# Patient Record
Sex: Female | Born: 1960 | Race: White | Hispanic: No | Marital: Married | Smoking: Never smoker
Health system: Southern US, Community
[De-identification: ages and names within clinical notes are randomized; demographics above are authoritative.]

## PROBLEM LIST (undated history)

## (undated) DIAGNOSIS — G43109 Migraine with aura, not intractable, without status migrainosus: Principal | ICD-10-CM

## (undated) DIAGNOSIS — G43909 Migraine, unspecified, not intractable, without status migrainosus: Secondary | ICD-10-CM

## (undated) DIAGNOSIS — E669 Obesity, unspecified: Secondary | ICD-10-CM

## (undated) DIAGNOSIS — C801 Malignant (primary) neoplasm, unspecified: Secondary | ICD-10-CM

## (undated) DIAGNOSIS — R42 Dizziness and giddiness: Secondary | ICD-10-CM

## (undated) DIAGNOSIS — E538 Deficiency of other specified B group vitamins: Secondary | ICD-10-CM

## (undated) DIAGNOSIS — M1990 Unspecified osteoarthritis, unspecified site: Secondary | ICD-10-CM

## (undated) DIAGNOSIS — Z862 Personal history of diseases of the blood and blood-forming organs and certain disorders involving the immune mechanism: Secondary | ICD-10-CM

## (undated) HISTORY — DX: Personal history of diseases of the blood and blood-forming organs and certain disorders involving the immune mechanism: Z86.2

## (undated) HISTORY — DX: Migraine, unspecified, not intractable, without status migrainosus: G43.909

## (undated) HISTORY — DX: Migraine with aura, not intractable, without status migrainosus: G43.109

## (undated) HISTORY — DX: Dizziness and giddiness: R42

## (undated) HISTORY — DX: Deficiency of other specified B group vitamins: E53.8

## (undated) HISTORY — PX: TOE SURGERY: SHX1073

## (undated) HISTORY — DX: Obesity, unspecified: E66.9

---

## 2003-03-17 ENCOUNTER — Other Ambulatory Visit: Admission: RE | Admit: 2003-03-17 | Discharge: 2003-03-17 | Payer: Self-pay | Admitting: Family Medicine

## 2004-05-19 ENCOUNTER — Ambulatory Visit: Payer: Self-pay | Admitting: Family Medicine

## 2005-06-05 ENCOUNTER — Ambulatory Visit: Payer: Self-pay | Admitting: Family Medicine

## 2005-06-19 ENCOUNTER — Encounter: Admission: RE | Admit: 2005-06-19 | Discharge: 2005-06-19 | Payer: Self-pay | Admitting: Family Medicine

## 2006-07-19 ENCOUNTER — Encounter: Admission: RE | Admit: 2006-07-19 | Discharge: 2006-07-19 | Payer: Self-pay | Admitting: Obstetrics & Gynecology

## 2008-12-22 ENCOUNTER — Ambulatory Visit: Payer: Self-pay | Admitting: Internal Medicine

## 2008-12-22 DIAGNOSIS — E785 Hyperlipidemia, unspecified: Secondary | ICD-10-CM

## 2008-12-22 DIAGNOSIS — R5383 Other fatigue: Secondary | ICD-10-CM

## 2008-12-22 DIAGNOSIS — R5381 Other malaise: Secondary | ICD-10-CM

## 2008-12-22 DIAGNOSIS — R42 Dizziness and giddiness: Secondary | ICD-10-CM

## 2008-12-29 ENCOUNTER — Encounter (INDEPENDENT_AMBULATORY_CARE_PROVIDER_SITE_OTHER): Payer: Self-pay | Admitting: *Deleted

## 2008-12-29 LAB — CONVERTED CEMR LAB
ALT: 14 units/L (ref 0–35)
AST: 20 units/L (ref 0–37)
Albumin: 3.6 g/dL (ref 3.5–5.2)
Alkaline Phosphatase: 77 units/L (ref 39–117)
BUN: 11 mg/dL (ref 6–23)
Basophils Absolute: 0 10*3/uL (ref 0.0–0.1)
Basophils Relative: 0 % (ref 0.0–3.0)
Bilirubin, Direct: 0.1 mg/dL (ref 0.0–0.3)
CO2: 26 meq/L (ref 19–32)
Calcium: 8.9 mg/dL (ref 8.4–10.5)
Chloride: 106 meq/L (ref 96–112)
Creatinine, Ser: 0.6 mg/dL (ref 0.4–1.2)
Eosinophils Absolute: 0.1 10*3/uL (ref 0.0–0.7)
Eosinophils Relative: 1.1 % (ref 0.0–5.0)
Free T4: 0.9 ng/dL (ref 0.6–1.6)
GFR calc non Af Amer: 113.74 mL/min (ref >60)
Glucose, Bld: 75 mg/dL (ref 70–99)
HCT: 36.9 % (ref 36.0–46.0)
Hemoglobin: 12.3 g/dL (ref 12.0–15.0)
Lymphocytes Relative: 30 % (ref 12.0–46.0)
Lymphs Abs: 1.9 10*3/uL (ref 0.7–4.0)
MCHC: 33.5 g/dL (ref 30.0–36.0)
MCV: 83.4 fL (ref 78.0–100.0)
Monocytes Absolute: 0.3 10*3/uL (ref 0.1–1.0)
Monocytes Relative: 4.1 % (ref 3.0–12.0)
Neutro Abs: 3.9 10*3/uL (ref 1.4–7.7)
Neutrophils Relative %: 64.8 % (ref 43.0–77.0)
Platelets: 337 10*3/uL (ref 150.0–400.0)
Potassium: 3.8 meq/L (ref 3.5–5.1)
RBC: 4.42 M/uL (ref 3.87–5.11)
RDW: 12.4 % (ref 11.5–14.6)
Sodium: 138 meq/L (ref 135–145)
TSH: 1.23 microintl units/mL (ref 0.35–5.50)
Total Bilirubin: 0.6 mg/dL (ref 0.3–1.2)
Total Protein: 7.4 g/dL (ref 6.0–8.3)
WBC: 6.2 10*3/uL (ref 4.5–10.5)

## 2008-12-30 ENCOUNTER — Ambulatory Visit: Payer: Self-pay | Admitting: Internal Medicine

## 2008-12-30 LAB — CONVERTED CEMR LAB
OCCULT 1: NEGATIVE
OCCULT 2: NEGATIVE
OCCULT 3: NEGATIVE

## 2008-12-31 ENCOUNTER — Encounter (INDEPENDENT_AMBULATORY_CARE_PROVIDER_SITE_OTHER): Payer: Self-pay | Admitting: *Deleted

## 2009-03-23 ENCOUNTER — Encounter: Payer: Self-pay | Admitting: Internal Medicine

## 2009-03-23 ENCOUNTER — Encounter (INDEPENDENT_AMBULATORY_CARE_PROVIDER_SITE_OTHER): Payer: Self-pay | Admitting: *Deleted

## 2009-03-23 ENCOUNTER — Ambulatory Visit: Payer: Self-pay | Admitting: Family Medicine

## 2009-03-23 LAB — CONVERTED CEMR LAB
Glucose, Bld: 131 mg/dL
Hemoglobin: 12.1 g/dL

## 2009-07-30 ENCOUNTER — Ambulatory Visit: Payer: Self-pay | Admitting: Family Medicine

## 2009-07-30 DIAGNOSIS — R1310 Dysphagia, unspecified: Secondary | ICD-10-CM | POA: Insufficient documentation

## 2009-07-30 DIAGNOSIS — R635 Abnormal weight gain: Secondary | ICD-10-CM | POA: Insufficient documentation

## 2009-08-02 ENCOUNTER — Encounter (INDEPENDENT_AMBULATORY_CARE_PROVIDER_SITE_OTHER): Payer: Self-pay | Admitting: *Deleted

## 2009-08-02 LAB — CONVERTED CEMR LAB
BUN: 5 mg/dL — ABNORMAL LOW (ref 6–23)
Basophils Absolute: 0 10*3/uL (ref 0.0–0.1)
Basophils Relative: 1 % (ref 0–1)
CO2: 20 meq/L (ref 19–32)
Calcium: 9.3 mg/dL (ref 8.4–10.5)
Chloride: 105 meq/L (ref 96–112)
Creatinine, Ser: 0.7 mg/dL (ref 0.40–1.20)
Eosinophils Absolute: 0.2 10*3/uL (ref 0.0–0.7)
Eosinophils Relative: 3 % (ref 0–5)
Folate: 8.5 ng/mL
Free T4: 1.04 ng/dL (ref 0.80–1.80)
Glucose, Bld: 100 mg/dL — ABNORMAL HIGH (ref 70–99)
HCT: 36.4 % (ref 36.0–46.0)
Hemoglobin: 11.5 g/dL — ABNORMAL LOW (ref 12.0–15.0)
Lymphocytes Relative: 28 % (ref 12–46)
Lymphs Abs: 1.6 10*3/uL (ref 0.7–4.0)
MCHC: 31.6 g/dL (ref 30.0–36.0)
MCV: 82.7 fL (ref 78.0–100.0)
Monocytes Absolute: 0.6 10*3/uL (ref 0.1–1.0)
Monocytes Relative: 11 % (ref 3–12)
Neutro Abs: 3.4 10*3/uL (ref 1.7–7.7)
Neutrophils Relative %: 58 % (ref 43–77)
Platelets: 335 10*3/uL (ref 150–400)
Potassium: 4.8 meq/L (ref 3.5–5.3)
RBC: 4.4 M/uL (ref 3.87–5.11)
RDW: 13.5 % (ref 11.5–15.5)
Sodium: 140 meq/L (ref 135–145)
T3, Free: 3.2 pg/mL (ref 2.3–4.2)
TSH: 2.563 microintl units/mL (ref 0.350–4.500)
Vitamin B-12: 236 pg/mL (ref 211–911)
WBC: 5.8 10*3/uL (ref 4.0–10.5)

## 2009-08-17 ENCOUNTER — Ambulatory Visit: Payer: Self-pay | Admitting: Family Medicine

## 2009-08-17 ENCOUNTER — Ambulatory Visit (HOSPITAL_COMMUNITY): Admission: RE | Admit: 2009-08-17 | Discharge: 2009-08-17 | Payer: Self-pay | Admitting: Family Medicine

## 2009-08-17 DIAGNOSIS — D518 Other vitamin B12 deficiency anemias: Secondary | ICD-10-CM | POA: Insufficient documentation

## 2009-08-18 ENCOUNTER — Encounter (INDEPENDENT_AMBULATORY_CARE_PROVIDER_SITE_OTHER): Payer: Self-pay | Admitting: *Deleted

## 2009-08-18 ENCOUNTER — Telehealth (INDEPENDENT_AMBULATORY_CARE_PROVIDER_SITE_OTHER): Payer: Self-pay | Admitting: *Deleted

## 2009-08-26 ENCOUNTER — Telehealth: Payer: Self-pay | Admitting: Family Medicine

## 2009-08-26 ENCOUNTER — Ambulatory Visit: Payer: Self-pay | Admitting: Family Medicine

## 2009-09-02 ENCOUNTER — Ambulatory Visit: Payer: Self-pay | Admitting: Family Medicine

## 2009-09-09 ENCOUNTER — Ambulatory Visit: Payer: Self-pay | Admitting: Family Medicine

## 2009-09-15 ENCOUNTER — Encounter (INDEPENDENT_AMBULATORY_CARE_PROVIDER_SITE_OTHER): Payer: Self-pay | Admitting: *Deleted

## 2009-09-15 ENCOUNTER — Ambulatory Visit: Payer: Self-pay | Admitting: Gastroenterology

## 2009-09-16 ENCOUNTER — Ambulatory Visit: Payer: Self-pay | Admitting: Family Medicine

## 2010-02-24 ENCOUNTER — Encounter: Payer: Self-pay | Admitting: Family Medicine

## 2010-03-08 ENCOUNTER — Ambulatory Visit: Payer: Self-pay | Admitting: Family Medicine

## 2010-03-14 LAB — CONVERTED CEMR LAB
Basophils Relative: 0.4 % (ref 0.0–3.0)
CO2: 24 meq/L (ref 19–32)
Calcium: 9 mg/dL (ref 8.4–10.5)
Chloride: 107 meq/L (ref 96–112)
Eosinophils Absolute: 0.1 10*3/uL (ref 0.0–0.7)
Glucose, Bld: 99 mg/dL (ref 70–99)
HCT: 31.8 % — ABNORMAL LOW (ref 36.0–46.0)
Hemoglobin: 10.6 g/dL — ABNORMAL LOW (ref 12.0–15.0)
Lymphocytes Relative: 37.8 % (ref 12.0–46.0)
Lymphs Abs: 1.6 10*3/uL (ref 0.7–4.0)
MCHC: 33.2 g/dL (ref 30.0–36.0)
MCV: 76.2 fL — ABNORMAL LOW (ref 78.0–100.0)
Neutro Abs: 2.3 10*3/uL (ref 1.4–7.7)
Potassium: 4.3 meq/L (ref 3.5–5.1)
RBC: 4.18 M/uL (ref 3.87–5.11)
Saturation Ratios: 7.1 % — ABNORMAL LOW (ref 20.0–50.0)
Sodium: 137 meq/L (ref 135–145)

## 2010-03-21 ENCOUNTER — Ambulatory Visit: Payer: Self-pay | Admitting: Family Medicine

## 2010-03-28 ENCOUNTER — Ambulatory Visit: Payer: Self-pay | Admitting: Family Medicine

## 2010-04-04 ENCOUNTER — Ambulatory Visit: Payer: Self-pay | Admitting: Family Medicine

## 2010-04-12 ENCOUNTER — Ambulatory Visit: Payer: Self-pay | Admitting: Family Medicine

## 2010-05-10 ENCOUNTER — Other Ambulatory Visit: Payer: Self-pay | Admitting: Family Medicine

## 2010-05-10 ENCOUNTER — Ambulatory Visit
Admission: RE | Admit: 2010-05-10 | Discharge: 2010-05-10 | Payer: Self-pay | Source: Home / Self Care | Attending: Family Medicine | Admitting: Family Medicine

## 2010-05-11 LAB — VITAMIN B12: Vitamin B-12: 318 pg/mL (ref 211–911)

## 2010-05-24 NOTE — Assessment & Plan Note (Signed)
Summary: see lab work from urgent care for anemia///sph   Vital Signs:  Patient profile:   50 year old female Weight:      168.0 pounds Temp:     97.4 degrees F oral Pulse rate:   82 / minute BP sitting:   108 / 72  (left arm) Cuff size:   regular  Vitals Entered By: Almeta Monas CMA Duncan Dull) (March 08, 2010 8:25 AM)                                                                                                                                                                                      CC: pt here to discuss lab results from urgent care   History of Present Illness: Pt here f/u UC labs.   She felt like her heart was beating fast in throat and this seems to occur around her period only.  Today she feels fine,  just tired.   Pt was given rx for xanax and b blocker but she didn't fill either. No chest pain.    Current Medications (verified): 1)  Slow Fe 160 (50 Fe) Mg Cr-Tabs (Ferrous Sulfate Dried) .Marland Kitchen.. 1 By Mouth Once Daily  Allergies (verified): No Known Drug Allergies  Past History:  Past Medical History: Last updated: 09/15/2009 Hyperlipidemia (Rxed statin not taken)  UTI  Past Surgical History: Last updated: 09/15/2009 G3 P3 ; Wisdom Teet Extraction   Family History: Last updated: 09/15/2009 Father: neg Mother: MVP Siblings: bro melanoma no esophagus disease  Social History: Last updated: 09/15/2009 Occupation: Client associate Married Never Smoked Alcohol use-no Regular exercise-no  Drinks 6 caffeinated beverages a day  Risk Factors: Exercise: no (12/22/2008)  Risk Factors: Smoking Status: never (12/22/2008)  Family History: Reviewed history from 09/15/2009 and no changes required. Father: neg Mother: MVP Siblings: bro melanoma no esophagus disease  Social History: Reviewed history from 09/15/2009 and no changes required. 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 Lungs:  Normal respiratory effort, chest expands symmetrically. Lungs are clear to auscultation, no crackles or wheezes. Heart:  normal rate and no murmur.   Skin:  Intact without suspicious lesions or rashes Cervical Nodes:  No lymphadenopathy noted Psych:  Oriented X3 and normally interactive.     Impression & Recommendations:  Problem # 1:  ANEMIA, B12 DEFICIENCY (ICD-281.1) reviewed labs from UC Her updated medication list for this problem includes:  Slow Fe 160 (50 Fe) Mg Cr-tabs (Ferrous sulfate dried) .Marland Kitchen... 1 by mouth once daily  Orders: Venipuncture ([address]) TLB-B12 + Folate Pnl (60454_09811-B14/NWG) TLB-IBC Pnl (Iron/FE;Transferrin) (83550-IBC) TLB-BMP (Basic Metabolic Panel-BMET) (80048-METABOL) TLB-CBC Platelet - w/Differential (85025-CBCD) TLB-TSH (Thyroid Stimulating Hormone) (84443-TSH)  Hgb: 11.5 (07/30/2009)   Hct: 36.4 (07/30/2009)   Platelets: 335 (07/30/2009) RBC: 4.40 (07/30/2009)   RDW: 13.5 (07/30/2009)   WBC: 5.8 (07/30/2009) MCV: 82.7 (07/30/2009)   MCHC: 31.6 (07/30/2009) B12: 485 (09/16/2009)   Folate: 8.5 (07/30/2009)   TSH: 2.563 (07/30/2009)  Problem # 2:  FATIGUE (ICD-780.79)  Orders: Venipuncture (95621) TLB-B12 + Folate Pnl (30865_78469-G29/BMW) TLB-IBC Pnl (Iron/FE;Transferrin) (83550-IBC) TLB-BMP (Basic Metabolic Panel-BMET) (80048-METABOL) TLB-CBC Platelet - w/Differential (85025-CBCD) TLB-TSH (Thyroid Stimulating Hormone) (84443-TSH)  Complete Medication List: 1)  Slow Fe 160 (50 Fe) Mg Cr-tabs (Ferrous sulfate dried) .Marland Kitchen.. 1 by mouth once daily  Patient Instructions: 1)  get slow fe OTC and start taking that 1x a day 2)  recheck labs in 1 month---285.9  cbcd, ibc, ferritin , b12   Orders  Added: 1)  Venipuncture [36415] 2)  TLB-B12 + Folate Pnl [82746_82607-B12/FOL] 3)  TLB-IBC Pnl (Iron/FE;Transferrin) [83550-IBC] 4)  TLB-BMP (Basic Metabolic Panel-BMET) [80048-METABOL] 5)  TLB-CBC Platelet - w/Differential [85025-CBCD] 6)  TLB-TSH (Thyroid Stimulating Hormone) [84443-TSH] 7)  Est. Patient Level III [41324]    Prevention & Chronic Care Immunizations   Influenza vaccine: Not documented    Tetanus booster: Not documented    Pneumococcal vaccine: Not documented  Other Screening   Pap smear: Not documented    Mammogram: Not documented   Smoking status: never  (12/22/2008)  Lipids   Total Cholesterol: Not documented   LDL: Not documented   LDL Direct: Not documented   HDL: Not documented   Triglycerides: Not documented    SGOT (AST): 20  (12/22/2008)   SGPT (ALT): 14  (12/22/2008)   Alkaline phosphatase: 77  (12/22/2008)   Total bilirubin: 0.6  (12/22/2008)  Self-Management Support :    Lipid self-management support: Not documented     Appended Document: see lab work from urgent care for anemia///sph

## 2010-05-24 NOTE — Assessment & Plan Note (Signed)
Summary: 1st b12/cbs  Nurse Visit   Allergies: No Known Drug Allergies  Medication Administration  Injection # 1:    Medication: Vit B12 1000 mcg    Diagnosis: ANEMIA, B12 DEFICIENCY (ICD-281.1)    Route: IM    Site: R deltoid    Exp Date: 06/23/2011    Lot #: [id number]    Mfr: [employer]ent    Patient tolerated injection without complications    Given by: Jeremy Johann CMA (March 21, 2010 2:24 PM)  Orders Added: 1)  Admin of Therapeutic Inj  intramuscular or subcutaneous [96372] 2)  Vit B12 1000 mcg [J3420]

## 2010-05-24 NOTE — Assessment & Plan Note (Signed)
Summary: b-12/cbs  Nurse Visit   Allergies: No Known Drug Allergies  Medication Administration  Injection # 1:    Medication: Vit B12 1000 mcg    Diagnosis: ANEMIA, B12 DEFICIENCY (ICD-281.1)    Route: IM    Site: L deltoid    Exp Date: 05/2011    Lot #: [id number]    Mfr: [employer]    Patient tolerated injection without complications    Given by: Army Fossa CMA (August 17, 2009 9:16 AM)  Orders Added: 1)  Admin of Therapeutic Inj  intramuscular or subcutaneous [96372] 2)  Vit B12 1000 mcg [J3420]

## 2010-05-24 NOTE — Assessment & Plan Note (Signed)
History of Present Illness Visit Type: Initial Consult Primary GI MD: Rob Bunting MD Primary Provider: Laury Axon Requesting Provider: Loreen Freud, MD Chief Complaint: dysphagia History of Present Illness:      who has globus. This is been going on for about 2 months. Swallowing does not affect it at all. The symptom is fairly constant. She was referred to 48 modified barium swallow study and this showed normal swallowing mechanisms however the bolus seem to lodge in her mid esophagus and distal esophagus very briefly. She was referred to both ear nose and throat as well as GI.  she has no dysphagia (not solid or liquid).  Overall her weight is up 15 in past year.  Never had pyrosis. No acid regurg.    She has globus, constantly.  She was having alot of sneezing.  No connection between   No esophageal disease in family.  Tried prevacid for about a week without changes.           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  All other review of systems was otherwise negative.   Vital Signs:  Patient profile:   50 year old female Height:      63 inches Weight:      177 pounds BMI:     31.47 Pulse rate:   72 / minute Pulse rhythm:   regular BP sitting:   108 / 78  (left arm) Cuff size:   regular  Vitals Entered By: June McMurray CMA Duncan Dull) (Sep 15, 2009 10:37 AM)  Physical Exam  Additional Exam:   Constitutional: generally well appearing Psychiatric: alert and oriented times 3 Eyes: extraocular movements intact Mouth: oropharynx moist, no lesions Neck: supple, no lymphadenopathy Cardiovascular: heart regular rate and rythm Lungs: CTA bilaterally Abdomen: soft, non-tender, non-distended, no obvious ascites, no peritoneal signs, normal bowel sounds Extremities: no lower extremity edema bilaterally Skin: no lesions on visible extremities    Impression & Recommendations:  Problem # 1:  globus in the absence of GERD symptoms, abnormal modified barium swallow study I think we should proceed with EGD at her soonest convenience to rule out significant esophagitis, structural disease of the esophagus. Reflux can certainly cause or contribute to globus however it is a very unusual to do so in the absence of any other GERD symptoms. She had a brief trial of proton pump inhibitor but I don't think this was enough to adequately judge whether it is making a difference. She will therefore also restart proton pump inhibitor once daily. I've given her samples for this.  Patient Instructions: 1)  Samples of PPI given (5 weeks), best taken 20-30 min prior to dinner. 2)  You will be scheduled to have an upper endoscopy. 3)  A copy of this information will be sent to Dr. Laury Axon. 4)  The medication list was reviewed and reconciled.  All changed / newly prescribed medications were explained.  A complete medication list was provided to the  patient / caregiver.  Appended Document: Orders Update/EGD    Clinical Lists Changes  Orders: Added new Test order of EGD (EGD) - Signed      Appended Document:  nexium samples  Samples Given # 5  -Lot Number:  [id number]  12/13

## 2010-05-24 NOTE — Assessment & Plan Note (Signed)
Summary: b-12/cbs  Nurse Visit   Allergies: No Known Drug Allergies  Medication Administration  Injection # 1:    Medication: Vit B12 1000 mcg    Diagnosis: ANEMIA, B12 DEFICIENCY (ICD-281.1)    Route: IM    Site: R deltoid    Exp Date: 06/23/2011    Lot #: [id number]    Mfr: [employer]    Patient tolerated injection without complications    Given by: Jeremy Johann CMA (March 28, 2010 2:24 PM)  Orders Added: 1)  Admin of Therapeutic Inj  intramuscular or subcutaneous [96372] 2)  Vit B12 1000 mcg [J3420]

## 2010-05-24 NOTE — Letter (Signed)
Summary: Primary Care Consult Scheduled Letter  [facility] [place]  [address] [place], [place] [address]   Phone: [phone]  Fax: [phone]      08/02/2009 MRN: [id number]  VERSA CRATON [address] [place], [place]  [address]    Dear Ms. Kerekes,    We have scheduled an appointment for you.  At the recommendation of Dr. Loreen Freud, we have scheduled you a consult with [facility] on 08-27-2009 at 1:00pm.  Their address is [address], [place]. The office phone number is [phone].  If this appointment day and time is not convenient for you, please feel free to call the office of the doctor you are being referred to at the number listed above and reschedule the appointment.    It is important for you to keep your scheduled appointments. We are here to make sure you are given good patient care.   Thank you,    Renee, Patient Care Coordinator [place] [facility]

## 2010-05-24 NOTE — Assessment & Plan Note (Signed)
Summary: sore throat/kdc   Vital Signs:  Patient profile:   50 year old female Weight:      176 pounds Temp:     98.5 degrees F oral Pulse rate:   78 / minute Pulse rhythm:   regular BP sitting:   122 / 80  (left arm) Cuff size:   regular  Vitals Entered By: Army Fossa CMA (July 30, 2009 10:51 AM) CC: Pt here c/o feels like something is stuck in her throat- has been feeling this way for a month. Has had some sinus issues.   History of Present Illness: Pt states she feels like things get stuck in her throat when she eats etc.  This has been going on about 6 weeks.   She is also c/o weight gain.  Pt still with dry cough but dizziness was long before sinus acted up again---she has been here several times for dizziness---see previous ov notes.  Allergies (verified): No Known Drug Allergies  Past History:  Past medical, surgical, family and social histories (including risk factors) reviewed for relevance to current acute and chronic problems.  Past Medical History: Reviewed history from 12/22/2008 and no changes required. Hyperlipidemia (Rxed statin not taken)  Past Surgical History: Reviewed history from 12/22/2008 and no changes required. G3 P3 ; Wisdom Teet Extraction  Family History: Reviewed history from 12/22/2008 and no changes required. Father: neg Mother: MVP Siblings: bro melanoma  Social History: Reviewed history from 12/22/2008 and no changes required. 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  Otoscopic examination reveals clear canals, tympanic membranes are intact bilaterally without bulging, retraction, inflammation or discharge. Hearing is grossly normal  bilaterally. Nose:  External nasal examination shows no deformity or inflammation. Nasal mucosa are pink and moist without lesions or exudates. Mouth:  Oral mucosa and oropharynx without lesions or exudates.  Teeth in good repair. Neck:  No deformities, masses, or tenderness noted. Lungs:  Normal respiratory effort, chest expands symmetrically. Lungs are clear to auscultation, no crackles or wheezes. Heart:  normal rate and no murmur.     Impression & Recommendations:  Problem # 1:  DIZZINESS (ICD-780.4)  The following medications were removed from the medication list:    Antivert 25 Mg Tabs (Meclizine hcl) .Marland Kitchen... 1 by mouth qid as needed    Promethazine Hcl 25 Mg Tabs (Promethazine hcl) .Marland Kitchen... 1 by mouth qid as needed  Orders: Misc. Referral (Misc. Ref)  Problem # 2:  WEIGHT GAIN (ICD-783.1)  Orders: Venipuncture (04540)  Problem # 3:  DYSPHAGIA UNSPECIFIED (JWJ-191.47)  Orders: Radiology Referral (Radiology)

## 2010-05-24 NOTE — Letter (Signed)
Summary: EGD Instructions  [facility]  [address] [place], [place] [address]   Phone: [phone]  Fax: [phone]       Victoria Hampton    1960-09-17    MRN: [id number]       Procedure Day /Date:10/11/09  MON     Arrival Time: 130 pm     Procedure Time:230 pm     Location of Procedure:                    [facility] (4th Floor)    PREPARATION FOR ENDOSCOPY   On 10/11/09 THE DAY OF THE PROCEDURE:  1.   No solid foods, milk or milk products are allowed after midnight the night before your procedure.  2.   Do not drink anything colored red or purple.  Avoid juices with pulp.  No orange juice.  3.  You may drink clear liquids until 1230 pm, which is 2 hours before your procedure.                                                                                                CLEAR LIQUIDS INCLUDE: Water Jello Ice Popsicles Tea (sugar ok, no milk/cream) Powdered fruit flavored drinks Coffee (sugar ok, no milk/cream) Gatorade Juice: apple, white grape, white cranberry  Lemonade Clear bullion, consomm, broth Carbonated beverages (any kind) Strained chicken noodle soup Hard Candy   MEDICATION INSTRUCTIONS  Unless otherwise instructed, you should take regular prescription medications with a small sip of water as early as possible the morning of your procedure.            OTHER INSTRUCTIONS  You will need a responsible adult at least 49 years of age to accompany you and drive you home.   This person must remain in the waiting room during your procedure.  Wear loose fitting clothing that is easily removed.  Leave jewelry and other valuables at home.  However, you may wish to bring a book to read or an iPod/MP3 player to listen to music as you wait for your procedure to start.  Remove all body piercing jewelry and leave at home.  Total time from sign-in until discharge is approximately 2-3 hours.  You should go home directly after  your procedure and rest.  You can resume normal activities the day after your procedure.  The day of your procedure you should not:   Drive   Make legal decisions   Operate machinery   Drink alcohol   Return to work  You will receive specific instructions about eating, activities and medications before you leave.    The above instructions have been reviewed and explained to me by   _______________________    I fully understand and can verbalize these instructions _____________________________ Date _________

## 2010-05-24 NOTE — Assessment & Plan Note (Signed)
Summary: 4th b-12/cbs  Nurse Visit   Allergies: No Known Drug Allergies  Medication Administration  Injection # 1:    Medication: Vit B12 1000 mcg    Diagnosis: ANEMIA, B12 DEFICIENCY (ICD-281.1)    Route: IM    Site: L deltoid    Exp Date: 07/2011    Lot #: [id number]    Mfr: [employer]s    Patient tolerated injection without complications    Given by: Army Fossa CMA (Sep 09, 2009 2:52 PM)  Orders Added: 1)  Admin of Therapeutic Inj  intramuscular or subcutaneous [96372] 2)  Vit B12 1000 mcg [J3420]

## 2010-05-24 NOTE — Letter (Signed)
Summary: New Patient letter  [facility]  [address] [place], [place] [address]   Phone: [phone]  Fax: [phone]       08/18/2009 MRN: [id number]  Victoria Hampton [address] [place], [place]  [address]  Dear Ms. Spainhour,  Welcome to the Gastroenterology Division at [facility].    You are scheduled to see Dr.  Christella Hartigan on 09-15-09 at 10:30a.m. on the 3rd floor at [facility], [address]  We ask that you try to arrive at our office 15 minutes prior to your appointment time to allow for check-in.  We would like you to complete the enclosed self-administered evaluation form prior to your visit and bring it with you on the day of your appointment.  We will review it with you.  Also, please bring a complete list of all your medications or, if you prefer, bring the medication bottles and we will list them.  Please bring your insurance card so that we may make a copy of it.  If your insurance requires a referral to see a specialist, please bring your referral form from your primary care physician.  Co-payments are due at the time of your visit and may be paid by cash, check or credit card.     Your office visit will consist of a consult with your physician (includes a physical exam), any laboratory testing he/she may order, scheduling of any necessary diagnostic testing (e.g. x-ray, ultrasound, CT-scan), and scheduling of a procedure (e.g. Endoscopy, Colonoscopy) if required.  Please allow enough time on your schedule to allow for any/all of these possibilities.    If you cannot keep your appointment, please call [phone] to cancel or reschedule prior to your appointment date.  This allows [place] the opportunity to schedule an appointment for another patient in need of care.  If you do not cancel or reschedule by 5 p.m. the business day prior to your appointment date, you will be charged a $50.00 late cancellation/no-show fee.    Thank you for choosing [facility] for your medical needs.  We appreciate the opportunity to care for you.  Please visit [place] at our website  to learn more about our practice.                     Sincerely,                                                             The Gastroenterology Division

## 2010-05-24 NOTE — Assessment & Plan Note (Signed)
Summary: 2nd b-12//cbs  Nurse Visit   Allergies: No Known Drug Allergies  Medication Administration  Injection # 1:    Medication: Vit B12 1000 mcg    Diagnosis: ANEMIA, B12 DEFICIENCY (ICD-281.1)    Route: IM    Site: R deltoid    Exp Date: 05/2011    Lot #: [id number]    Mfr: [employer] Regent    Patient tolerated injection without complications    Given by: Army Fossa CMA (Aug 26, 2009 9:06 AM)  Orders Added: 1)  Admin of Therapeutic Inj  intramuscular or subcutaneous [96372] 2)  Vit B12 1000 mcg [J3420]

## 2010-05-24 NOTE — Letter (Signed)
Summary: Primary Care Consult Scheduled Letter  [facility] [place]  [address] [place], [place] [address]   Phone: [phone]  Fax: [phone]      08/02/2009 MRN: [id number]  KEARSTIN LEARN [address] [place], [place]  [address]    Dear Ms. Belote,    We have scheduled an appointment for you.  At the recommendation of Dr. Loreen Freud, we have scheduled you for a Barium Swallow Test with [facility] on 08-17-2009 arrive by 10:45am to 1st floor Admitting to register.  Their address is [address] [place] [address]. The office phone number is [phone].  If this appointment day and time is not convenient for you, please feel free to call the office of the doctor you are being referred to at the number listed above and reschedule the appointment.    It is important for you to keep your scheduled appointments. We are here to make sure you are given good patient care.   Thank you,    Renee, Patient Care Coordinator [place] at [facility]

## 2010-05-24 NOTE — Letter (Signed)
Summary: Unable To Reach-Consult Scheduled  [facility] [place]  [address] [place], [place] [address]   Phone: [phone]  Fax: [phone]    08/02/2009 MRN: [id number]    Dear Ms. Aguila,   We have been unable to reach you by phone.  Please contact our office with an updated phone number.      Thank you,  Army Fossa CMA  August 02, 2009 11:40 AM

## 2010-05-24 NOTE — Progress Notes (Signed)
Summary: Referral  Phone Note Call from Patient   Summary of Call: Receieved Speech Pathology Report- Per Dr.Lowne refer to GI for evaluation. Pt is aware is and okay with referral. Army Fossa CMA  August 18, 2009 10:02 AM

## 2010-05-24 NOTE — Assessment & Plan Note (Signed)
Summary: 3rd b-12/cbs  Nurse Visit   Allergies: No Known Drug Allergies  Medication Administration  Injection # 1:    Medication: Vit B12 1000 mcg    Diagnosis: ANEMIA, B12 DEFICIENCY (ICD-281.1)    Route: IM    Site: R deltoid    Exp Date: 05/2011    Lot #: [id number]    Mfr: [employer] Regent    Patient tolerated injection without complications    Given by: Army Fossa CMA (Sep 02, 2009 3:40 PM)  Orders Added: 1)  Admin of Therapeutic Inj  intramuscular or subcutaneous [96372] 2)  Vit B12 1000 mcg [J3420]

## 2010-05-24 NOTE — Progress Notes (Signed)
Summary: Victoria Hampton CANCEL APPT  Phone Note From Other Clinic Call back at [phone]   Caller: [facility]) Summary of Call: [facility] and did not reschedule...............Marland KitchenFelecia Deloach CMA  Aug 26, 2009 1:37 PM

## 2010-05-26 NOTE — Assessment & Plan Note (Signed)
Summary: 4th b-12/cbs  Nurse Visit   Allergies: No Known Drug Allergies  Medication Administration  Injection # 1:    Medication: Vit B12 1000 mcg    Diagnosis: ANEMIA, B12 DEFICIENCY (ICD-281.1)    Route: IM    Site: R deltoid    Exp Date: 06/23/2011    Lot #: [id number]    Mfr: [employer]ent    Patient tolerated injection without complications    Given by: Jeremy Johann CMA (April 12, 2010 2:19 PM)  Orders Added: 1)  Vit B12 1000 mcg [J3420] 2)  Admin of Therapeutic Inj  intramuscular or subcutaneous [60454]

## 2010-05-26 NOTE — Assessment & Plan Note (Signed)
Summary: b-12/cbs  Nurse Visit   Allergies: No Known Drug Allergies  Medication Administration  Injection # 1:    Medication: Vit B12 1000 mcg    Diagnosis: ANEMIA, B12 DEFICIENCY (ICD-281.1)    Route: IM    Site: L deltoid    Exp Date: 06/23/2011    Lot #: [id number]    Mfr: [employer]ent    Patient tolerated injection without complications    Given by: Jeremy Johann CMA (April 05, 2010 2:22 PM)  Orders Added: 1)  Admin of Therapeutic Inj  intramuscular or subcutaneous [96372] 2)  Vit B12 1000 mcg [J3420]

## 2010-06-10 ENCOUNTER — Ambulatory Visit: Payer: Self-pay

## 2010-07-20 ENCOUNTER — Other Ambulatory Visit: Payer: Self-pay | Admitting: Obstetrics & Gynecology

## 2010-07-20 DIAGNOSIS — R1032 Left lower quadrant pain: Secondary | ICD-10-CM

## 2010-07-22 ENCOUNTER — Ambulatory Visit
Admission: RE | Admit: 2010-07-22 | Discharge: 2010-07-22 | Disposition: A | Discharge status: Home / Self Care | Payer: Managed Care, Other (non HMO) | Source: Ambulatory Visit | Attending: Obstetrics & Gynecology | Admitting: Obstetrics & Gynecology

## 2010-07-22 DIAGNOSIS — R1032 Left lower quadrant pain: Secondary | ICD-10-CM

## 2010-07-22 MED ORDER — IOHEXOL 300 MG/ML  SOLN: 100.0000 mL | Freq: Once | INTRAMUSCULAR | Status: AC | PRN

## 2010-07-22 MED ORDER — IOHEXOL 300 MG/ML  SOLN
100.0000 mL | Freq: Once | INTRAMUSCULAR | Status: AC | PRN
  Administered 2010-07-22: 100 mL via INTRAVENOUS

## 2011-04-25 HISTORY — PX: APPENDECTOMY: SHX54

## 2011-10-09 DIAGNOSIS — G43009 Migraine without aura, not intractable, without status migrainosus: Secondary | ICD-10-CM | POA: Insufficient documentation

## 2012-07-27 ENCOUNTER — Other Ambulatory Visit: Payer: Self-pay | Admitting: Neurology

## 2012-09-12 ENCOUNTER — Ambulatory Visit (INDEPENDENT_AMBULATORY_CARE_PROVIDER_SITE_OTHER): Payer: Managed Care, Other (non HMO) | Admitting: Family Medicine

## 2012-09-12 ENCOUNTER — Encounter: Payer: Self-pay | Admitting: Family Medicine

## 2012-09-12 VITALS — BP 118/70 | HR 92 | Temp 98.5°F | Wt 167.6 lb

## 2012-09-12 DIAGNOSIS — R319 Hematuria, unspecified: Secondary | ICD-10-CM

## 2012-09-12 LAB — POCT URINALYSIS DIPSTICK
Bilirubin, UA: NEGATIVE
Glucose, UA: NEGATIVE
Nitrite, UA: NEGATIVE
Urobilinogen, UA: 0.2
pH, UA: 6

## 2012-09-12 MED ORDER — CIPROFLOXACIN HCL 250 MG PO TABS: 250.0000 mg | ORAL_TABLET | Freq: Two times a day (BID) | ORAL | Status: DC

## 2012-09-12 NOTE — Patient Instructions (Addendum)
Hematuria, Adult Hematuria (blood in your urine) can be caused by a bladder infection (cystitis), kidney infection (pyelonephritis), prostate infection (prostatitis), or kidney stone. Infections will usually respond to antibiotics (medications which kill germs), and a kidney stone will usually pass through your urine without further treatment. If you were put on antibiotics, take all the medicine until gone. You may feel better in a few days, but take all of your medicine or the infection may not respond and become more difficult to treat. If antibiotics were not given, an infection did not cause the blood in the urine. A further work up to find out the reason may be needed. HOME CARE INSTRUCTIONS   Drink lots of fluid, 3 to 4 quarts a day. If you have been diagnosed with an infection, cranberry juice is especially recommended, in addition to large amounts of water.  Avoid caffeine, tea, and carbonated beverages, because they tend to irritate the bladder.  Avoid alcohol as it may irritate the prostate.  Only take over-the-counter or prescription medicines for pain, discomfort, or fever as directed by your caregiver.  If you have been diagnosed with a kidney stone follow your caregivers instructions regarding straining your urine to catch the stone. TO PREVENT FURTHER INFECTIONS:  Empty the bladder often. Avoid holding urine for long periods of time.  After a bowel movement, women should cleanse front to back. Use each tissue only once.  Empty the bladder before and after sexual intercourse if you are a female.  Return to your caregiver if you develop back pain, fever, nausea (feeling sick to your stomach), vomiting, or your symptoms (problems) are not better in 3 days. Return sooner if you are getting worse. If you have been requested to return for further testing make sure to keep your appointments. If an infection is not the cause of blood in your urine, X-rays may be required. Your caregiver  will discuss this with you. SEEK IMMEDIATE MEDICAL CARE IF:   You have a persistent fever over 102 F (38.9 C).  You develop severe vomiting and are unable to keep the medication down.  You develop severe back or abdominal pain despite taking your medications.  You begin passing a large amount of blood or clots in your urine.  You feel extremely weak or faint, or pass out. MAKE SURE YOU:   Understand these instructions.  Will watch your condition.  Will get help right away if you are not doing well or get worse. Document Released: 04/10/2005 Document Revised: 07/03/2011 Document Reviewed: 11/28/2007 Exit[employer] Patient Information 2014 [employer].  

## 2012-09-12 NOTE — Progress Notes (Signed)
  Subjective:    Victoria Hampton is a 52 y.o. female who complains of hematuria. She has had symptoms for 4 days. Patient also complains of nothing. Patient denies back pain, congestion, cough, fever, headache, rhinitis, sorethroat, stomach ache and vaginal discharge. Patient does have a history of recurrent UTI. Patient does not have a history of pyelonephritis.   The following portions of the patient's history were reviewed and updated as appropriate:  She  has no past medical history on file. She  does not have any pertinent problems on file. She  has no past surgical history on file. Her family history is not on file. She  reports that she has never smoked. She has never used smokeless tobacco. She reports that she does not drink alcohol or use illicit drugs. She has a current medication list which includes the following prescription(s): norethindrone-ethinyl estradiol and topiramate. Current Outpatient Prescriptions on File Prior to Visit  Medication Sig Dispense Refill  . topiramate (TOPAMAX) 25 MG tablet TAKE 3 TABLETS AT BEDTIME  270 tablet  0   No current facility-administered medications on file prior to visit.   She has No Known Allergies..  Review of Systems Pertinent items are noted in HPI.    Objective:    BP 118/70  Pulse 92  Temp(Src) 98.5 F (36.9 C) (Oral)  Wt 167 lb 9.6 oz (76.023 kg)  BMI 29.7 kg/m2  SpO2 98% General appearance: alert, cooperative, appears stated age and no distress Abdomen: soft, non-tender; bowel sounds normal; no masses,  no organomegaly  Laboratory:  Urine dipstick: mod for hemoglobin, 3+ for leukocyte esterase and trace for protein.   Micro exam: not done.    Assessment:    hematuria     Plan:    Medications: ciprofloxacin. Maintain adequate hydration. Follow up if symptoms not improving, and as needed.  If needs urology-- Dr Edwin Cap in [facility]

## 2012-09-15 LAB — URINE CULTURE

## 2012-09-18 ENCOUNTER — Ambulatory Visit (HOSPITAL_BASED_OUTPATIENT_CLINIC_OR_DEPARTMENT_OTHER)
Admission: RE | Admit: 2012-09-18 | Discharge: 2012-09-18 | Disposition: A | Discharge status: Home / Self Care | Payer: Managed Care, Other (non HMO) | Source: Ambulatory Visit | Attending: Family Medicine | Admitting: Family Medicine

## 2012-09-18 ENCOUNTER — Other Ambulatory Visit: Payer: Self-pay | Admitting: Family Medicine

## 2012-09-18 ENCOUNTER — Telehealth: Payer: Self-pay | Admitting: General Practice

## 2012-09-18 DIAGNOSIS — R1032 Left lower quadrant pain: Secondary | ICD-10-CM | POA: Insufficient documentation

## 2012-09-18 DIAGNOSIS — R319 Hematuria, unspecified: Secondary | ICD-10-CM

## 2012-09-18 NOTE — Telephone Encounter (Signed)
Noted  

## 2012-09-18 NOTE — Telephone Encounter (Signed)
Pt called stating that she is having severe hematuria. States that it is every time she urinates. Culture came back as negative. Please advise.

## 2012-09-18 NOTE — Telephone Encounter (Signed)
Ct abd pelvis-- r/o stones---order put in

## 2012-09-20 ENCOUNTER — Telehealth: Payer: Self-pay

## 2012-09-20 DIAGNOSIS — R319 Hematuria, unspecified: Secondary | ICD-10-CM

## 2012-09-20 NOTE — Telephone Encounter (Signed)
Ref put in.      KP 

## 2012-09-26 ENCOUNTER — Other Ambulatory Visit: Payer: Managed Care, Other (non HMO)

## 2012-10-18 ENCOUNTER — Other Ambulatory Visit: Payer: Self-pay | Admitting: Neurology

## 2012-10-29 ENCOUNTER — Other Ambulatory Visit: Payer: Self-pay | Admitting: Neurology

## 2012-11-08 ENCOUNTER — Other Ambulatory Visit: Payer: Self-pay | Admitting: Neurology

## 2012-11-18 ENCOUNTER — Encounter: Payer: Self-pay | Admitting: Neurology

## 2012-11-18 DIAGNOSIS — G43009 Migraine without aura, not intractable, without status migrainosus: Secondary | ICD-10-CM

## 2012-11-19 ENCOUNTER — Other Ambulatory Visit: Payer: Self-pay

## 2012-11-19 ENCOUNTER — Encounter: Payer: Self-pay | Admitting: Neurology

## 2012-11-19 ENCOUNTER — Ambulatory Visit (INDEPENDENT_AMBULATORY_CARE_PROVIDER_SITE_OTHER): Payer: 59 | Admitting: Neurology

## 2012-11-19 VITALS — BP 105/71 | HR 71 | Ht 64.5 in | Wt 165.0 lb

## 2012-11-19 DIAGNOSIS — G43109 Migraine with aura, not intractable, without status migrainosus: Secondary | ICD-10-CM

## 2012-11-19 HISTORY — DX: Migraine with aura, not intractable, without status migrainosus: G43.109

## 2012-11-19 MED ORDER — TOPIRAMATE 25 MG PO TABS: 75.0000 mg | ORAL_TABLET | Freq: Every day | ORAL | Status: DC

## 2012-11-19 NOTE — Telephone Encounter (Signed)
Pharmacy called stating ins requires 90 day Rx.  I have updated and resent Rx.

## 2012-11-19 NOTE — Progress Notes (Signed)
   Reason for visit: Migraine  Victoria Hampton is an 52 y.o. female  History of present illness:  Victoria Hampton is a 52 year old left-handed white female with a history of migraine headache. The patient has a history of vertigo that may be associated with headache. The patient has also noted episodes of visual disturbance where the vision appears to be inverted. The patient may develop a bifrontal headache following the episode of visual disturbance. The patient has done very well since last seen, with the last episode occurring in April 2014. The patient is on Topamax taking 75 mg at night, and she is tolerating the medication well. The patient recently has come off of her birth control pills, but she continues to have an irregular menstrual cycle. The patient may end up going back on the birth control. The patient returns for an evaluation.  Past Medical History  Diagnosis Date  . Migraine     with visual disturbance  . Vertigo   . History of anemia   . Vitamin B12 deficiency   . Mild obesity   . Migraine with aura 11/19/2012    Past Surgical History  Procedure Laterality Date  . Appendectomy  04/2011    Family History  Problem Relation Age of Onset  . Interstitial cystitis Mother   . Cancer Mother     bcc  . Migraines Brother     Social history:  reports that she has never smoked. She has never used smokeless tobacco. She reports that she does not drink alcohol or use illicit drugs.  Allergies: No Known Allergies  Medications:  No current outpatient prescriptions on file prior to visit.   No current facility-administered medications on file prior to visit.    ROS:  Out of a complete 14 system review of symptoms, the patient complains only of the following symptoms, and all other reviewed systems are negative.  Headache Irregular menstrual cycle  Blood pressure 105/71, pulse 71, height 5' 4.5" (1.638 m), weight 165 lb (74.844 kg).  Physical Exam  General: The patient is  alert and cooperative at the time of the examination. The patient is minimally obese.  Skin: No significant peripheral edema is noted.   Neurologic Exam  Cranial nerves: Facial symmetry is present. Speech is normal, no aphasia or dysarthria is noted. Extraocular movements are full. Visual fields are full.  Motor: The patient has good strength in all 4 extremities.  Coordination: The patient has good finger-nose-finger and heel-to-shin bilaterally.  Gait and station: The patient has a normal gait. Tandem gait is normal. Romberg is negative. No drift is seen.  Reflexes: Deep tendon reflexes are symmetric.   Assessment/Plan:  1. Migraine headache  The patient is doing quite well on the Topamax. Once the hormone therapy is regulated, the patient may try to go down to 50 mg at night of the Topamax if the episodes remained infrequent. The patient otherwise will followup in one year. A prescription was given for the Topamax.  Marlan Palau MD 11/19/2012 3:14 PM  [facility] [address] [place], [place] [address]  Phone [phone] Fax [phone]

## 2013-01-22 ENCOUNTER — Encounter: Payer: Self-pay | Admitting: Family Medicine

## 2013-02-27 ENCOUNTER — Other Ambulatory Visit: Payer: Self-pay

## 2013-10-13 LAB — HM PAP SMEAR

## 2013-10-13 LAB — HM MAMMOGRAPHY

## 2013-11-19 ENCOUNTER — Ambulatory Visit (INDEPENDENT_AMBULATORY_CARE_PROVIDER_SITE_OTHER): Payer: 59 | Admitting: Neurology

## 2013-11-19 ENCOUNTER — Encounter: Payer: Self-pay | Admitting: Neurology

## 2013-11-19 VITALS — BP 109/74 | HR 73 | Wt 166.0 lb

## 2013-11-19 DIAGNOSIS — G43109 Migraine with aura, not intractable, without status migrainosus: Secondary | ICD-10-CM

## 2013-11-19 MED ORDER — TOPIRAMATE 25 MG PO TABS: 25.0000 mg | ORAL_TABLET | Freq: Every day | ORAL | Status: DC

## 2013-11-19 NOTE — Progress Notes (Signed)
    Reason for visit: Migraine  Victoria Hampton is an 53 y.o. female  History of present illness:  Victoria Hampton is a 53 year old left-handed white female with a history of migraine-type events mainly associated with dizziness, not headache. The patient has done very well on Topamax taking 50 mg at night. She reports no episodes over the last 12 months. She has been on and off birth control pills, currently she is not on any hormone replacement therapy. The patient indicates that she may be in menopause. The patient is otherwise on no medications. The patient reports troubles with memory and concentration on Topamax. She returns for an evaluation.  Past Medical History  Diagnosis Date  . Migraine     with visual disturbance  . Vertigo   . History of anemia   . Vitamin B12 deficiency   . Mild obesity   . Migraine with aura 11/19/2012    Past Surgical History  Procedure Laterality Date  . Appendectomy  04/2011    Family History  Problem Relation Age of Onset  . Interstitial cystitis Mother   . Cancer Mother     bcc  . Migraines Brother     Social history:  reports that she has never smoked. She has never used smokeless tobacco. She reports that she does not drink alcohol or use illicit drugs.   No Known Allergies  Medications:  No current outpatient prescriptions on file prior to visit.   No current facility-administered medications on file prior to visit.    ROS:  Out of a complete 14 system review of symptoms, the patient complains only of the following symptoms, and all other reviewed systems are negative.  Dizziness  Blood pressure 109/74, pulse 73, weight 166 lb (75.297 kg).  Physical Exam  General: The patient is alert and cooperative at the time of the examination. The patient is minimally obese.  Skin: No significant peripheral edema is noted.   Neurologic Exam  Mental status: The patient is oriented x 3.  Cranial nerves: Facial symmetry is present. Speech  is normal, no aphasia or dysarthria is noted. Extraocular movements are full. Visual fields are full.  Motor: The patient has good strength in all 4 extremities.  Sensory examination: Soft touch sensation is symmetric on the face, arms, and legs.  Coordination: The patient has good finger-nose-finger and heel-to-shin bilaterally.  Gait and station: The patient has a normal gait. Tandem gait is normal. Romberg is negative. No drift is seen.  Reflexes: Deep tendon reflexes are symmetric.   Assessment/Plan:  1. History of migraine, migraine equivalent  The patient is doing fairly well at this time. The patient will drop the Topamax dose to 25 mg at night. A prescription was written for the medication. She will followup otherwise in one year. She is having some cognitive side effects on the medication.  Marlan Palau. Keith Yechiel Erny MD 11/19/2013 11:40 AM  [facility] [address] [place], [place] [address]  Phone [phone] Fax [phone]

## 2013-11-19 NOTE — Patient Instructions (Signed)

## 2014-03-20 IMAGING — CT CT ABD-PELV W/O CM
2 of 4 series · 16 of 46 positions shown, 18 images · non-contrast
Comparison: 07/22/2010

CLINICAL DATA: Hematuria and flank pain.  Left-sided symptoms.
Prior appendectomy.

CT ABDOMEN AND PELVIS WITHOUT CONTRAST
TECHNIQUE: Multidetector CT imaging of the abdomen and pelvis was
performed following the standard protocol without intravenous
contrast.

[Series 2: renal stone < 200 lbs 5.0 b31f · axial · 0.72mm/px · z∈[-506,-71]mm · 13 of 95 slices shown, 15 images]
[im 4/95  soft-tissue]
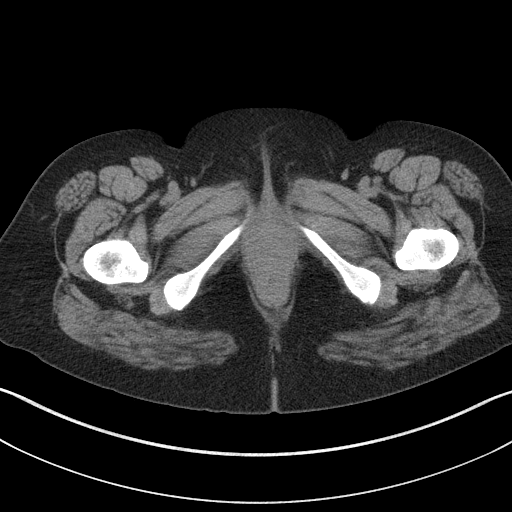
[im 4/95  bone]
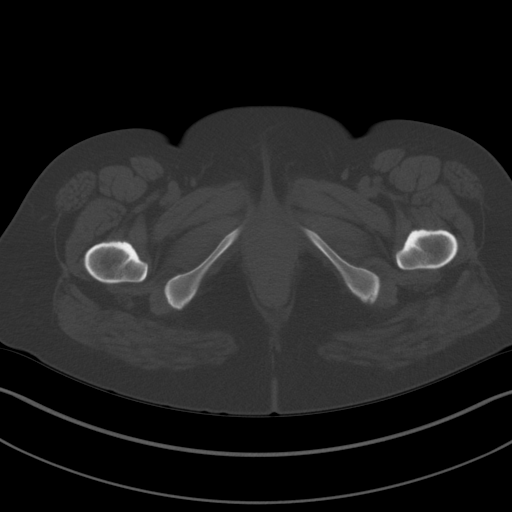
[im 12/95  soft-tissue]
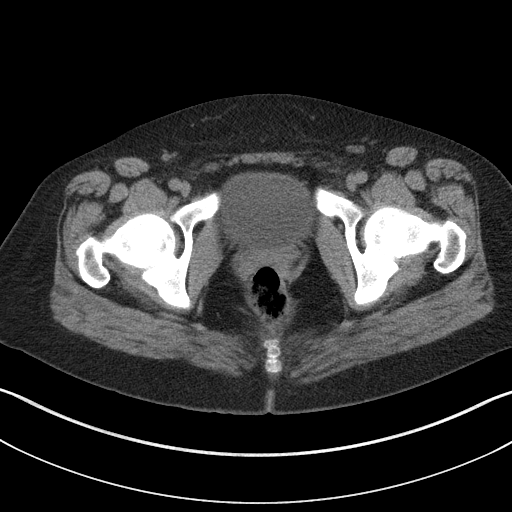
[im 20/95  soft-tissue]
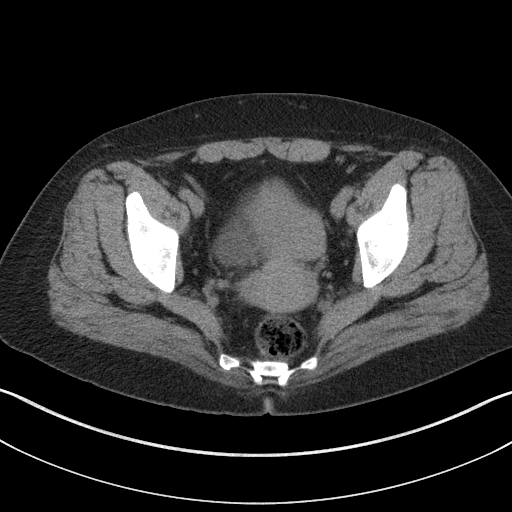
[im 28/95  soft-tissue]
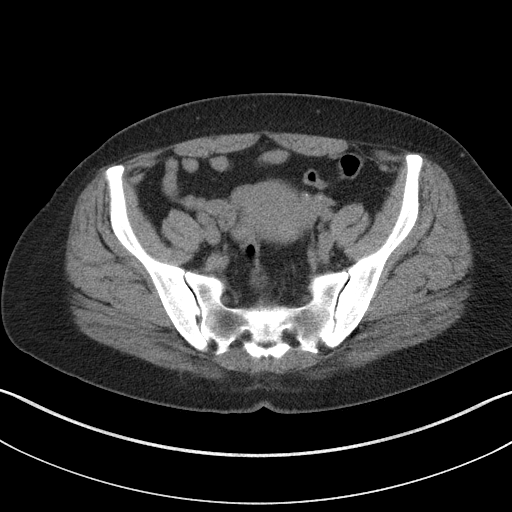
[im 32/95  soft-tissue]
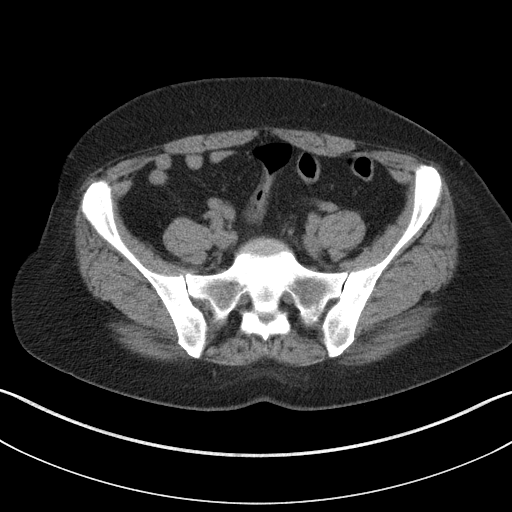
[im 40/95  soft-tissue]
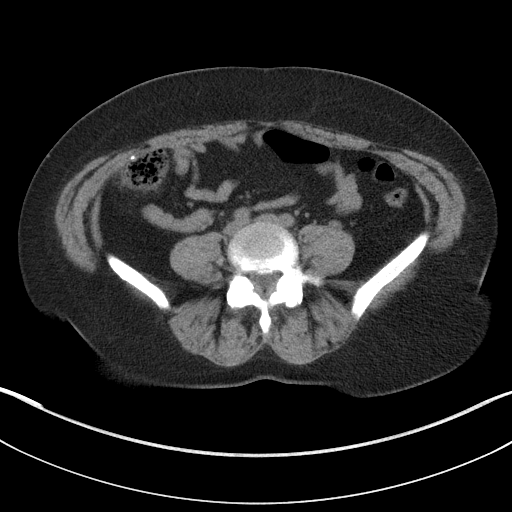
[im 48/95  soft-tissue]
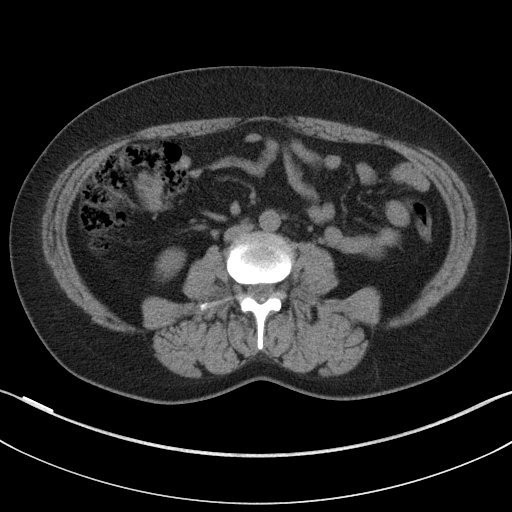
[im 55/95  soft-tissue]
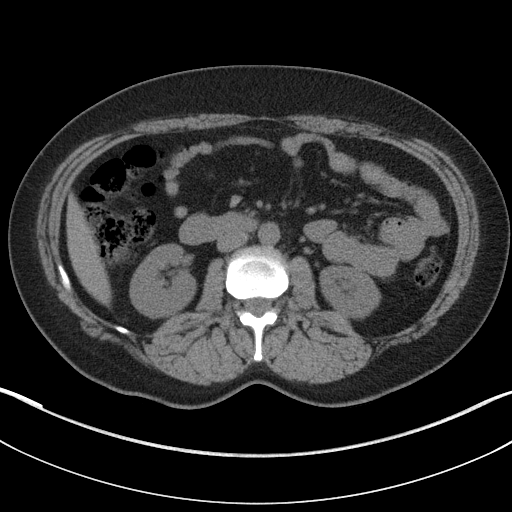
[im 63/95  soft-tissue]
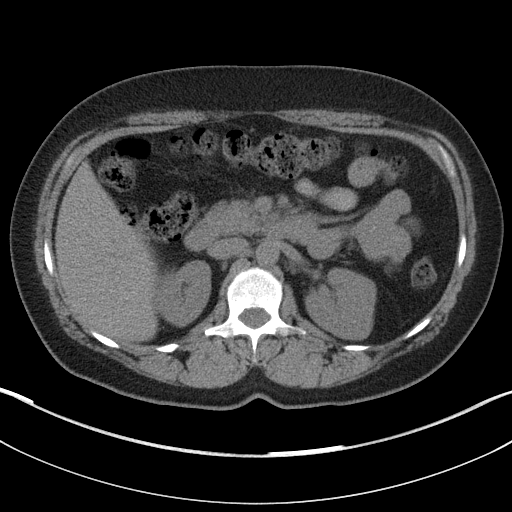
[im 63/95  bone]
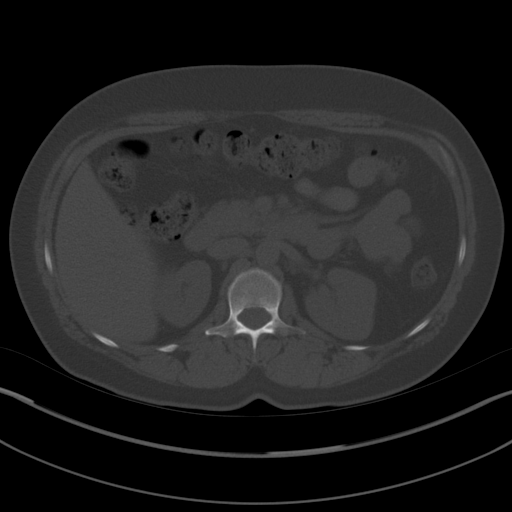
[im 67/95  soft-tissue]
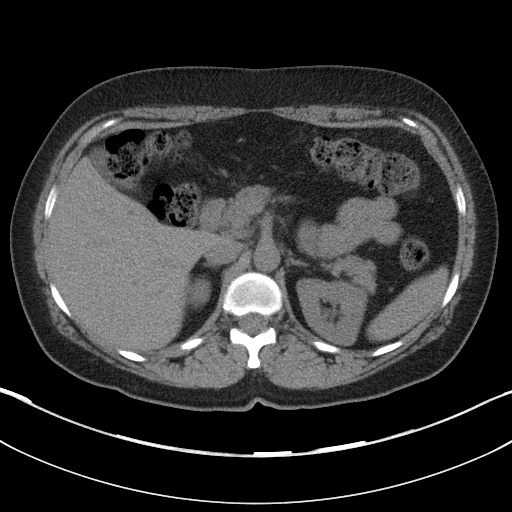
[im 75/95  soft-tissue]
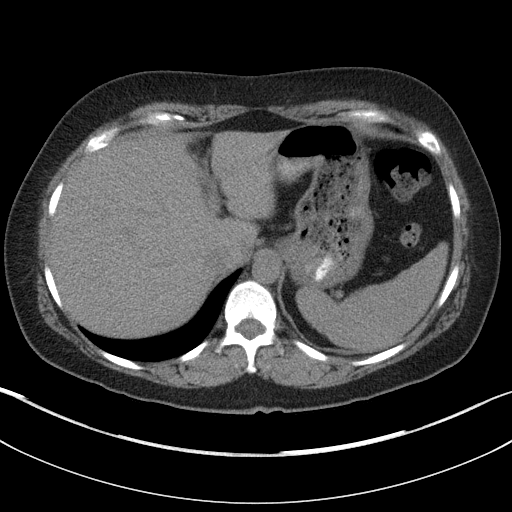
[im 83/95  soft-tissue]
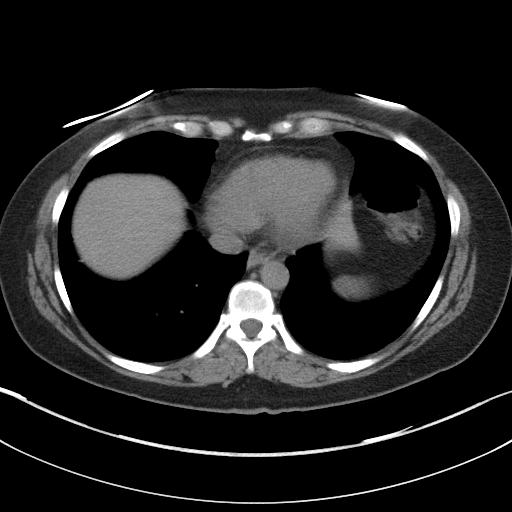
[im 91/95  soft-tissue]
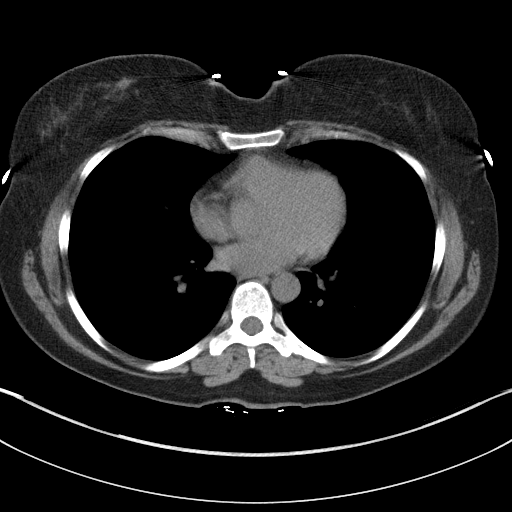

[Series 5: renal stone 3.0 coronal · coronal · 0.76mm/px · 3 of 80 slices shown]
[im 27/80  soft-tissue]
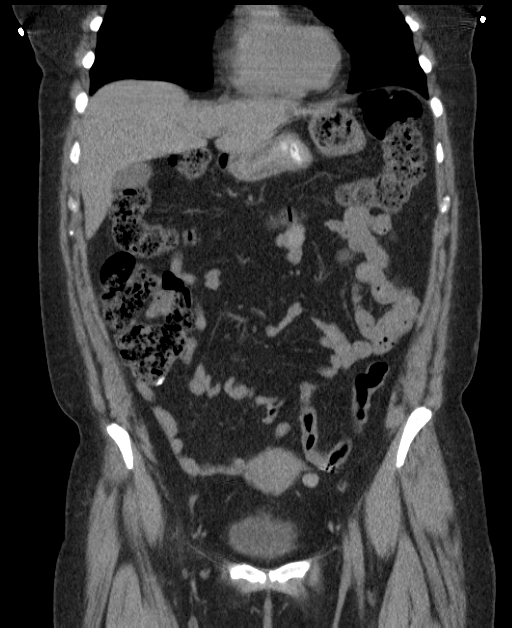
[im 36/80  soft-tissue]
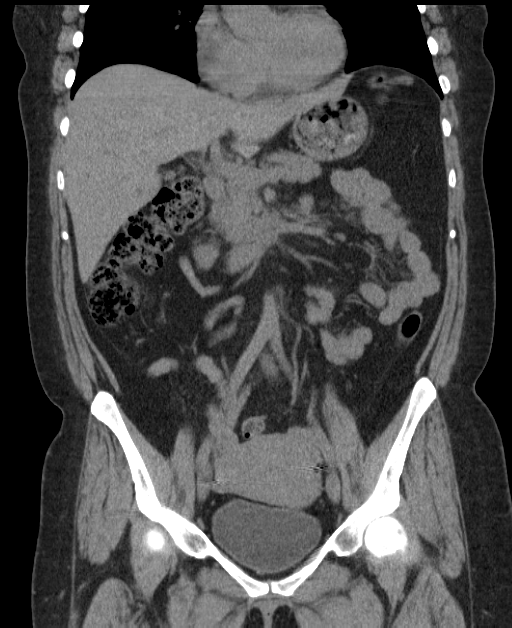
[im 44/80  soft-tissue]
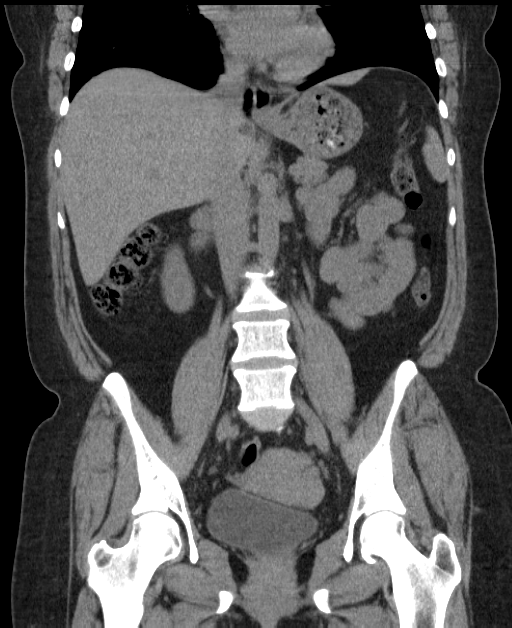

[16 of 46 positions shown; findings below may reference images not displayed]

FINDINGS: Multiple small low density liver lesions are stable,
measuring in the nine - 10 mm size range.  Spleen has normal
uninfused features.  The stomach, duodenum, pancreas, gallbladder,
and adrenal glands are unremarkable.

No stone disease in either kidney.  No hydronephrosis.  No ureteral
or bladder stones.  No secondary changes in either kidney or
ureter.

No abdominal aortic aneurysm.  No free fluid or lymphadenopathy.
Abdominal bowel loops are unremarkable.

Imaging through the pelvis shows no free intraperitoneal fluid.  No
pelvic sidewall lymphadenopathy.  Fibroid change noted in the
uterus.  No adnexal mass.

No substantial diverticular change in the colon.  There is no
colonic diverticulitis.  Terminal ileum is normal.
Nonvisualization of the appendix is consistent with the reported
history of prior appendectomy.

Bone windows reveal no worrisome lytic or sclerotic osseous
lesions.
IMPRESSION: No acute findings in the abdomen or pelvis.  No evidence explain
the patient's history of left flank pain.  Specifically, no
evidence for urinary stones.

## 2014-05-29 ENCOUNTER — Telehealth: Payer: Self-pay | Admitting: *Deleted

## 2014-05-29 NOTE — Telephone Encounter (Signed)
Called and rescheduled the appointment to [phone]

## 2014-11-20 ENCOUNTER — Ambulatory Visit: Payer: 59 | Admitting: Adult Health

## 2014-11-27 ENCOUNTER — Ambulatory Visit: Payer: Self-pay | Admitting: Adult Health

## 2015-07-23 ENCOUNTER — Inpatient Hospital Stay (HOSPITAL_COMMUNITY)
Admission: AD | Admit: 2015-07-23 | Discharge: 2015-07-23 | Disposition: A | Discharge status: Home / Self Care | Payer: BLUE CROSS/BLUE SHIELD | Source: Ambulatory Visit | Attending: Obstetrics and Gynecology | Admitting: Obstetrics and Gynecology

## 2015-07-23 ENCOUNTER — Encounter (HOSPITAL_COMMUNITY): Payer: Self-pay | Admitting: *Deleted

## 2015-07-23 DIAGNOSIS — N939 Abnormal uterine and vaginal bleeding, unspecified: Secondary | ICD-10-CM | POA: Insufficient documentation

## 2015-07-23 DIAGNOSIS — E669 Obesity, unspecified: Secondary | ICD-10-CM | POA: Insufficient documentation

## 2015-07-23 DIAGNOSIS — Z3202 Encounter for pregnancy test, result negative: Secondary | ICD-10-CM | POA: Diagnosis not present

## 2015-07-23 LAB — URINALYSIS, ROUTINE W REFLEX MICROSCOPIC
Bilirubin Urine: NEGATIVE
Glucose, UA: NEGATIVE mg/dL
Ketones, ur: NEGATIVE mg/dL
Nitrite: NEGATIVE
PROTEIN: 100 mg/dL — AB
pH: 6.5 (ref 5.0–8.0)

## 2015-07-23 LAB — CBC
HEMATOCRIT: 38.2 % (ref 36.0–46.0)
HEMOGLOBIN: 13.1 g/dL (ref 12.0–15.0)
MCH: 28.7 pg (ref 26.0–34.0)
MCHC: 34.3 g/dL (ref 30.0–36.0)
MCV: 83.6 fL (ref 78.0–100.0)
Platelets: 345 10*3/uL (ref 150–400)
RBC: 4.57 MIL/uL (ref 3.87–5.11)
RDW: 12.9 % (ref 11.5–15.5)
WBC: 6 10*3/uL (ref 4.0–10.5)

## 2015-07-23 LAB — POCT PREGNANCY, URINE: PREG TEST UR: NEGATIVE

## 2015-07-23 LAB — URINE MICROSCOPIC-ADD ON: SQUAMOUS EPITHELIAL / LPF: NONE SEEN

## 2015-07-23 MED ORDER — NORETHINDRONE 0.35 MG PO TABS: ORAL_TABLET | ORAL | Status: DC

## 2015-07-23 NOTE — MAU Provider Note (Signed)
History   [id number]   Chief Complaint  Patient presents with  . Vaginal Bleeding    HPI Victoria Hampton is a 55 y.o. female  [id number] here with report of vaginal bleeding that started 4 days ago.  Denies the passage of clots.   Reports using a pad an hour.  Menopause x 2 years.  Reports history of cervical polyp, does not recall when.  +lightheadedness.  No reports of fatigue, chest pain, or palpitations.  Also reports left sided pelvic pain.  Pain is described as sharp and rated a 5/10.  Patient's last menstrual period was 07/18/2010.  OB History  Gravida Para Term Preterm AB SAB TAB Ectopic Multiple Living  4 3   1 1    3     # Outcome Date GA Lbr Len/2nd Weight Sex Delivery Anes PTL Lv  4 SAB           3 Para           2 Para           1 Para               Past Medical History  Diagnosis Date  . Migraine     with visual disturbance  . Vertigo   . History of anemia   . Vitamin B12 deficiency   . Mild obesity   . Migraine with aura 11/19/2012    Family History  Problem Relation Age of Onset  . Interstitial cystitis Mother   . Cancer Mother     bcc  . Migraines Brother     Social History   Social History  . Marital Status: Married    Spouse Name: N/A  . Number of Children: 3  . Years of Education: N/A   Occupational History  .  [employer]   Social History Main Topics  . Smoking status: Never Smoker   . Smokeless tobacco: Never Used  . Alcohol Use: No  . Drug Use: No  . Sexual Activity: Yes    Birth Control/ Protection: None, Surgical   Other Topics Concern  . None   Social History Narrative    No Known Allergies  No current facility-administered medications on file prior to encounter.   Current Outpatient Prescriptions on File Prior to Encounter  Medication Sig Dispense Refill  . topiramate (TOPAMAX) 25 MG tablet Take 1 tablet (25 mg total) by mouth at bedtime. 90 tablet 3     Review of Systems  Constitutional: Negative for fever and  chills.  Genitourinary: Positive for vaginal bleeding and pelvic pain (cramping on left side).  Skin: Negative for pallor.  Neurological: Positive for dizziness.  All other systems reviewed and are negative.   Physical Exam   Filed Vitals:   07/23/15 1831  BP: 109/80  Pulse: 108  Temp: 97.8 F (36.6 C)  TempSrc: Oral  Resp: 16  Height: 5\' 3"  (1.6 m)  Weight: 168 lb (76.204 kg)    Physical Exam  Constitutional: She is oriented to person, place, and time. She appears well-developed and well-nourished.  HENT:  Head: Normocephalic.  Neck: Normal range of motion. Neck supple.  Cardiovascular: Normal rate, regular rhythm and normal heart sounds.   Respiratory: Effort normal and breath sounds normal.  GI: Soft. She exhibits no mass. There is tenderness. There is no guarding.  Genitourinary: There is bleeding (negative clots) in the vagina.  Neurological: She is alert and oriented to person, place, and time. She has normal reflexes.  Skin: Skin is warm and dry.    MAU Course  Procedures  MDM Results for orders placed or performed during the hospital encounter of 07/23/15 (from the past 24 hour(s))  Urinalysis, Routine w reflex microscopic (not at [facility])     Status: Abnormal   Collection Time: 07/23/15  6:25 PM  Result Value Ref Range   Color, Urine RED (A) YELLOW   APPearance CLEAR CLEAR   Specific Gravity, Urine <1.005 (L) 1.005 - 1.030   pH 6.5 5.0 - 8.0   Glucose, UA NEGATIVE NEGATIVE mg/dL   Hgb urine dipstick LARGE (A) NEGATIVE   Bilirubin Urine NEGATIVE NEGATIVE   Ketones, ur NEGATIVE NEGATIVE mg/dL   Protein, ur 914 (A) NEGATIVE mg/dL   Nitrite NEGATIVE NEGATIVE   Leukocytes, UA TRACE (A) NEGATIVE  Urine microscopic-add on     Status: Abnormal   Collection Time: 07/23/15  6:25 PM  Result Value Ref Range   Squamous Epithelial / LPF NONE SEEN NONE SEEN   WBC, UA 0-5 0 - 5 WBC/hpf   RBC / HPF TOO NUMEROUS TO COUNT 0 - 5 RBC/hpf   Bacteria, UA FEW (A) NONE SEEN   Pregnancy, urine POC     Status: None   Collection Time: 07/23/15  6:49 PM  Result Value Ref Range   Preg Test, Ur NEGATIVE NEGATIVE  CBC     Status: None   Collection Time: 07/23/15  7:28 PM  Result Value Ref Range   WBC 6.0 4.0 - 10.5 K/uL   RBC 4.57 3.87 - 5.11 MIL/uL   Hemoglobin 13.1 12.0 - 15.0 g/dL   HCT 78.2 95.6 - 21.3 %   MCV 83.6 78.0 - 100.0 fL   MCH 28.7 26.0 - 34.0 pg   MCHC 34.3 30.0 - 36.0 g/dL   RDW 08.6 57.8 - 46.9 %   Platelets 345 150 - 400 K/uL   2000 Consulted with Dr. Henderson Cloud > Reviewed HPI/Exam/labs > give Micronor with follow-up in office for ultrasound and endometrial biopsy   Assessment and Plan  Abnormal Uterine Bleeding - stable  Plan: Discharge home RX Micronor Follow-up in office next week Report if bleeding worsens  Marlis Edelson, CNM 07/23/2015 8:17 PM

## 2015-07-23 NOTE — MAU Note (Addendum)
Patient presents with c/o heavy bleeding since Tuesday. Reports pain on left side.

## 2015-07-23 NOTE — Discharge Instructions (Signed)

## 2015-08-04 ENCOUNTER — Encounter: Payer: Self-pay | Admitting: Family Medicine

## 2017-10-17 LAB — HM MAMMOGRAPHY

## 2020-08-21 ENCOUNTER — Emergency Department (HOSPITAL_BASED_OUTPATIENT_CLINIC_OR_DEPARTMENT_OTHER): Payer: BC Managed Care – PPO

## 2020-08-21 ENCOUNTER — Encounter (HOSPITAL_BASED_OUTPATIENT_CLINIC_OR_DEPARTMENT_OTHER): Payer: Self-pay | Admitting: Emergency Medicine

## 2020-08-21 ENCOUNTER — Emergency Department (HOSPITAL_BASED_OUTPATIENT_CLINIC_OR_DEPARTMENT_OTHER)
Admission: EM | Admit: 2020-08-21 | Discharge: 2020-08-21 | Disposition: A | Discharge status: Home / Self Care | Payer: BC Managed Care – PPO | Attending: Emergency Medicine | Admitting: Emergency Medicine

## 2020-08-21 ENCOUNTER — Other Ambulatory Visit: Payer: Self-pay

## 2020-08-21 DIAGNOSIS — R000 Tachycardia, unspecified: Secondary | ICD-10-CM | POA: Insufficient documentation

## 2020-08-21 DIAGNOSIS — R002 Palpitations: Secondary | ICD-10-CM | POA: Diagnosis present

## 2020-08-21 LAB — CBC WITH DIFFERENTIAL/PLATELET
Abs Immature Granulocytes: 0.01 10*3/uL (ref 0.00–0.07)
Basophils Absolute: 0 10*3/uL (ref 0.0–0.1)
Basophils Relative: 0 %
Eosinophils Absolute: 0.1 10*3/uL (ref 0.0–0.5)
Eosinophils Relative: 1 %
HCT: 44.8 % (ref 36.0–46.0)
Hemoglobin: 14.6 g/dL (ref 12.0–15.0)
Immature Granulocytes: 0 %
Lymphocytes Relative: 44 %
Lymphs Abs: 2.1 10*3/uL (ref 0.7–4.0)
MCH: 28.2 pg (ref 26.0–34.0)
MCHC: 32.6 g/dL (ref 30.0–36.0)
MCV: 86.7 fL (ref 80.0–100.0)
Monocytes Absolute: 0.4 10*3/uL (ref 0.1–1.0)
Monocytes Relative: 9 %
Neutro Abs: 2.2 10*3/uL (ref 1.7–7.7)
Neutrophils Relative %: 46 %
Platelets: 283 10*3/uL (ref 150–400)
RBC: 5.17 MIL/uL — ABNORMAL HIGH (ref 3.87–5.11)
RDW: 12.4 % (ref 11.5–15.5)
WBC: 4.8 10*3/uL (ref 4.0–10.5)
nRBC: 0 % (ref 0.0–0.2)

## 2020-08-21 LAB — D-DIMER, QUANTITATIVE: D-Dimer, Quant: 0.51 ug/mL-FEU — ABNORMAL HIGH (ref 0.00–0.50)

## 2020-08-21 LAB — COMPREHENSIVE METABOLIC PANEL
ALT: 19 U/L (ref 0–44)
AST: 22 U/L (ref 15–41)
Albumin: 4.2 g/dL (ref 3.5–5.0)
Alkaline Phosphatase: 81 U/L (ref 38–126)
Anion gap: 13 (ref 5–15)
BUN: 16 mg/dL (ref 6–20)
CO2: 21 mmol/L — ABNORMAL LOW (ref 22–32)
Calcium: 9.4 mg/dL (ref 8.9–10.3)
Chloride: 100 mmol/L (ref 98–111)
Creatinine, Ser: 0.71 mg/dL (ref 0.44–1.00)
GFR, Estimated: >60 mL/min (ref >60)
Glucose, Bld: 119 mg/dL — ABNORMAL HIGH (ref 70–99)
Potassium: 3.8 mmol/L (ref 3.5–5.1)
Sodium: 134 mmol/L — ABNORMAL LOW (ref 135–145)
Total Bilirubin: 0.4 mg/dL (ref 0.3–1.2)
Total Protein: 7.9 g/dL (ref 6.5–8.1)

## 2020-08-21 LAB — MAGNESIUM: Magnesium: 2 mg/dL (ref 1.7–2.4)

## 2020-08-21 LAB — TSH: TSH: 1.807 u[IU]/mL (ref 0.350–4.500)

## 2020-08-21 LAB — TROPONIN I (HIGH SENSITIVITY): Troponin I (High Sensitivity): 2 ng/L (ref <18)

## 2020-08-21 MED ORDER — SODIUM CHLORIDE 0.9 % IV BOLUS
1000.0000 mL | Freq: Once | INTRAVENOUS | Status: AC
  Administered 2020-08-21: 1000 mL via INTRAVENOUS

## 2020-08-21 NOTE — ED Triage Notes (Signed)
Pt arrives pov with driver, reports palpitations and tachycardia that started 1.5 hrs pta. Pt also reports nausea, denies emesis. Pt reports feeling shob and near syncope

## 2020-08-21 NOTE — ED Provider Notes (Signed)
[facility] EMERGENCY DEPARTMENT Provider Note   CSN: [id number] Arrival date & time: 08/21/20  1048     History Chief Complaint  Patient presents with  . Palpitations    Victoria Hampton is a 60 y.o. female.  HPI   60 year old female presents the emergency department concern for palpitations.  Patient states when she woke up this morning she felt like her heart rate is fast.  She was able to eat and drink without difficulty.  She drove with her friend to go shopping and while at the store she felt her heart rate was really fast, could not get out of the car she felt she was going to pass out.  No full syncope.  When the heart rate is really fast she has some mild chest pressure but currently denies any chest pain.  Mild associated shortness of breath, no cough.  No swelling of her lower extremities, no recent traveling, no hormone use.  No history of any cardiac problems in the past or arrhythmia.  Patient did have a dose of caffeine this morning which is not usual for her.  Denies any anxiety, lack of sleep, increased alcohol use.  Past Medical History:  Diagnosis Date  . History of anemia   . Migraine    with visual disturbance  . Migraine with aura 11/19/2012  . Mild obesity   . Vertigo   . Vitamin B12 deficiency     Patient Active Problem List   Diagnosis Date Noted  . Migraine with aura 11/19/2012  . Migraine without aura, without mention of intractable migraine without mention of status migrainosus 10/09/2011  . ANEMIA, B12 DEFICIENCY 08/17/2009  . WEIGHT GAIN 07/30/2009  . DYSPHAGIA UNSPECIFIED 07/30/2009  . HYPERLIPIDEMIA 12/22/2008  . DIZZINESS 12/22/2008  . FATIGUE 12/22/2008    Past Surgical History:  Procedure Laterality Date  . APPENDECTOMY  04/2011     OB History    Gravida  4   Para  3   Term      Preterm      AB  1   Living  3     SAB  1   IAB      Ectopic      Multiple      Live Births              Family History   Problem Relation Age of Onset  . Interstitial cystitis Mother   . Cancer Mother        bcc  . Migraines Brother     Social History   Tobacco Use  . Smoking status: Never Smoker  . Smokeless tobacco: Never Used  Substance Use Topics  . Alcohol use: No  . Drug use: No    Home Medications Prior to Admission medications   Medication Sig Start Date End Date Taking? Authorizing Provider  fexofenadine (ALLEGRA) 180 MG tablet Take 180 mg by mouth daily. 07/05/15   [provider]  fluticasone (FLONASE) 50 MCG/ACT nasal spray Place 1-2 sprays into both nostrils 2 (two) times daily as needed. 07/05/15   [provider]  norethindrone (MICRONOR,CAMILA,ERRIN) 0.35 MG tablet Take two tablets day#1, two day#2, two day#3, then one a day. 07/23/15   Karim-Rhoades, Kae Heller, CNM  Pseudoeph-Doxylamine-DM-APAP (NYQUIL PO) Take 30 mLs by mouth at bedtime as needed (For cold symptoms.).    [provider]    Allergies    Patient has no known allergies.  Review of Systems   Review  of Systems  Constitutional: Negative for chills and fever.  HENT: Negative for congestion.   Eyes: Negative for visual disturbance.  Respiratory: Negative for shortness of breath.   Cardiovascular: Positive for palpitations. Negative for chest pain and leg swelling.  Gastrointestinal: Negative for abdominal pain, diarrhea and vomiting.  Genitourinary: Negative for dysuria.  Skin: Negative for rash.  Neurological: Negative for headaches.    Physical Exam Updated Vital Signs BP 113/81   Pulse (!) 103   Temp 98.4 F (36.9 C) (Oral)   Resp (!) 21   Ht 5\' 3"  (1.6 m)   Wt 65.8 kg   LMP 07/18/2010   SpO2 100%   BMI 25.69 kg/m   Physical Exam Vitals and nursing note reviewed.  Constitutional:      Appearance: Normal appearance.  HENT:     Head: Normocephalic.     Mouth/Throat:     Mouth: Mucous membranes are moist.  Cardiovascular:     Rate and Rhythm: Tachycardia present.   Pulmonary:     Effort: Pulmonary effort is normal. No respiratory distress.     Breath sounds: No rales.  Abdominal:     Palpations: Abdomen is soft.     Tenderness: There is no abdominal tenderness.  Musculoskeletal:        General: No swelling.  Skin:    General: Skin is warm.  Neurological:     Mental Status: She is alert and oriented to person, place, and time. Mental status is at baseline.  Psychiatric:        Mood and Affect: Mood normal.     ED Results / Procedures / Treatments   Labs (all labs ordered are listed, but only abnormal results are displayed) Labs Reviewed  COMPREHENSIVE METABOLIC PANEL - Abnormal; Notable for the following components:      Result Value   Sodium 134 (*)    CO2 21 (*)    Glucose, Bld 119 (*)    All other components within normal limits  CBC WITH DIFFERENTIAL/PLATELET - Abnormal; Notable for the following components:   RBC 5.17 (*)    All other components within normal limits  MAGNESIUM  TSH  TROPONIN I (HIGH SENSITIVITY)    EKG None  Radiology No results found.  Procedures Procedures   Medications Ordered in ED Medications  sodium chloride 0.9 % bolus 1,000 mL (1,000 mLs Intravenous New Bag/Given 08/21/20 1215)    ED Course  I have reviewed the triage vital signs and the nursing notes.  Pertinent labs & imaging results that were available during my care of the patient were reviewed by me and considered in my medical decision making (see chart for details).    MDM Rules/Calculators/A&P                          60 year old female presents the emergency department with concern for palpitations.  While evaluating the patient on the monitor her heart rate ranges from 100-110, sinus rhythm.  EKG shows sinus tachycardia without any other acute findings.  She is currently chest pain-free.  Orthostatic vitals are negative.  Troponin is negative, D-dimer is 0.58 but age-adjusted this is negative.  She has no hypoxia or shortness of  breath.  After liter of hydration her tachycardia has resolved.  Blood work is otherwise reassuring, TSH is pending.  Chest x-ray is unremarkable.  Discussed with the patient to follow-up with her primary doctor for further evaluation and possible Holter monitoring.  Work-up  today is negative, discussed strict return to ED instructions.  Heart rates on reevaluation is 78, stable blood pressure.  She feels well and back to baseline, requesting to leave.  Patient will be discharged and treated as an outpatient.  Discharge plan and strict return to ED precautions discussed, patient verbalizes understanding and agreement.  Final Clinical Impression(s) / ED Diagnoses Final diagnoses:  None    Rx / DC Orders ED Discharge Orders    None       Rozelle Logan, DO 08/21/20 1534

## 2020-08-21 NOTE — Discharge Instructions (Addendum)
You have been seen and discharged from the emergency department.  Your cardiac work-up was normal.  Your heart rate has been normal while here in the department after IV hydration.  Stay well-hydrated, drink clear liquids.  Follow-up with your primary provider for reevaluation and further care.  You may require Holter monitoring for further evaluation.  Take home medications as prescribed. If you have any worsening symptoms, chest pain, difficulty breathing, passing out or further concerns for your health please return to an emergency department for further evaluation.

## 2021-07-13 ENCOUNTER — Other Ambulatory Visit: Payer: Self-pay | Admitting: Urology

## 2021-08-02 NOTE — Progress Notes (Signed)
Spoke with pt and pt states she will cancel 08-05-2021 surgery, pt given selita at dr Tresa Moore office number to call and cancel surgery. ?

## 2021-08-05 ENCOUNTER — Encounter (HOSPITAL_BASED_OUTPATIENT_CLINIC_OR_DEPARTMENT_OTHER): Admission: RE | Payer: Self-pay | Source: Home / Self Care

## 2021-08-05 ENCOUNTER — Ambulatory Visit (HOSPITAL_BASED_OUTPATIENT_CLINIC_OR_DEPARTMENT_OTHER): Admission: RE | Admit: 2021-08-05 | Payer: BC Managed Care – PPO | Source: Home / Self Care | Admitting: Urology

## 2021-08-05 SURGERY — CYSTOURETEROSCOPY, WITH RETROGRADE PYELOGRAM AND STENT INSERTION
Anesthesia: General | Laterality: Bilateral

## 2022-11-17 ENCOUNTER — Other Ambulatory Visit: Payer: Self-pay | Admitting: Urology

## 2022-12-01 NOTE — Progress Notes (Signed)
COVID Vaccine received:  []  No [x]  Yes Date of any COVID positive Test in last 90 days:  PCP -  Cardiologist -   Chest x-ray -  EKG -   Stress Test -  ECHO -  Cardiac Cath -   Bowel Prep - []  No  []   Yes ______  Pacemaker / ICD device []  No []  Yes   Spinal Cord Stimulator:[]  No []  Yes       History of Sleep Apnea? []  No []  Yes   CPAP used?- []  No []  Yes    Does the patient monitor blood sugar?          []  No []  Yes  []  N/A  Patient has: []  NO Hx DM   []  Pre-DM                 []  DM1  []   DM2 Does patient have a [employer] or Dexacom? []  No []  Yes   Fasting Blood Sugar Ranges-  Checks Blood Sugar _____ times a day  GLP1 agonist / usual dose -  GLP1 instructions:  SGLT-2 inhibitors / usual dose -  SGLT-2 instructions:   Blood Thinner / Instructions: Aspirin Instructions:  Comments:   Activity level: Patient is able / unable to climb a flight of stairs without difficulty; []  No CP  []  No SOB, but would have ___   Patient can / can not perform ADLs without assistance.   Anesthesia review:   Patient denies shortness of breath, fever, cough and chest pain at PAT appointment.  Patient verbalized understanding and agreement to the Pre-Surgical Instructions that were given to them at this PAT appointment. Patient was also educated of the need to review these PAT instructions again prior to his/her surgery.I reviewed the appropriate phone numbers to call if they have any and questions or concerns.

## 2022-12-01 NOTE — Patient Instructions (Signed)
SURGICAL WAITING ROOM VISITATION  Patients having surgery or a procedure may have no more than 2 support people in the waiting area - these visitors may rotate.    Children under the age of 30 must have an adult with them who is not the patient.  Due to an increase in RSV and influenza rates and associated hospitalizations, children ages 47 and under may not visit patients in [facility] hospitals.  If the patient needs to stay at the hospital during part of their recovery, the visitor guidelines for inpatient rooms apply. Pre-op nurse will coordinate an appropriate time for 1 support person to accompany patient in pre-op.  This support person may not rotate.    Please refer to the [facility] website for the visitor guidelines for Inpatients (after your surgery is over and you are in a regular room).       Your procedure is scheduled on: 12/15/22   Report to [facility] Main Entrance    Report to admitting at  8:45 AM   Call this number if you have problems the morning of surgery [phone]   Do not eat food  or drink liquids :After Midnight.      Oral Hygiene is also important to reduce your risk of infection.                                    Remember - BRUSH YOUR TEETH THE MORNING OF SURGERY WITH YOUR REGULAR TOOTHPASTE  DENTURES WILL BE REMOVED PRIOR TO SURGERY PLEASE DO NOT APPLY "Poly grip" OR ADHESIVES!!!   Stop all vitamins and herbal supplements 7 days before surgery.   Take these medicines the morning of surgery with A SIP OF WATER: none             You may not have any metal on your body including hair pins, jewelry, and body piercing             Do not wear make-up, lotions, powders, perfumes, or deodorant  Do not wear nail polish including gel and S&S, artificial/acrylic nails, or any other type of covering on natural nails including finger and toenails. If you have artificial nails, gel coating, etc. that needs to be removed by a nail salon please have  this removed prior to surgery or surgery may need to be canceled/ delayed if the surgeon/ anesthesia feels like they are unable to be safely monitored.   Do not shave  48 hours prior to surgery.    Do not bring valuables to the hospital. [facility] IS NOT             RESPONSIBLE   FOR VALUABLES.   Contacts, glasses, dentures or bridgework may not be worn into surgery.  DO NOT BRING YOUR HOME MEDICATIONS TO THE HOSPITAL. PHARMACY WILL DISPENSE MEDICATIONS LISTED ON YOUR MEDICATION LIST TO YOU DURING YOUR ADMISSION IN THE HOSPITAL!    Patients discharged on the day of surgery will not be allowed to drive home.  Someone NEEDS to stay with you for the first 24 hours after anesthesia.   Special Instructions: Bring a copy of your healthcare power of attorney and living will documents the day of surgery if you haven't scanned them before.              Please read over the following fact sheets you were given: IF YOU HAVE QUESTIONS ABOUT YOUR PRE-OP  INSTRUCTIONS PLEASE CALL [phone] Rosey Bath   If you received a COVID test during your pre-op visit  it is requested that you wear a mask when out in public, stay away from anyone that may not be feeling well and notify your surgeon if you develop symptoms. If you test positive for Covid or have been in contact with anyone that has tested positive in the last 10 days please notify you surgeon.    [facility] - Preparing for Surgery Before surgery, you can play an important role.  Because skin is not sterile, your skin needs to be as free of germs as possible.  You can reduce the number of germs on your skin by washing with CHG (chlorahexidine gluconate) soap before surgery.  CHG is an antiseptic cleaner which kills germs and bonds with the skin to continue killing germs even after washing. Please DO NOT use if you have an allergy to CHG or antibacterial soaps.  If your skin becomes reddened/irritated stop using the CHG and inform your nurse when you  arrive at Short Stay. Do not shave (including legs and underarms) for at least 48 hours prior to the first CHG shower.  You may shave your face/neck.  Please follow these instructions carefully:  1.  Shower with CHG Soap the night before surgery and the  morning of surgery.  2.  If you choose to wash your hair, wash your hair first as usual with your normal  shampoo.  3.  After you shampoo, rinse your hair and body thoroughly to remove the shampoo.                             4.  Use CHG as you would any other liquid soap.  You can apply chg directly to the skin and wash.  Gently with a scrungie or clean washcloth.  5.  Apply the CHG Soap to your body ONLY FROM THE NECK DOWN.   Do   not use on face/ open                           Wound or open sores. Avoid contact with eyes, ears mouth and   genitals (private parts).                       Wash face,  Genitals (private parts) with your normal soap.             6.  Wash thoroughly, paying special attention to the area where your    surgery  will be performed.  7.  Thoroughly rinse your body with warm water from the neck down.  8.  DO NOT shower/wash with your normal soap after using and rinsing off the CHG Soap.                9.  Pat yourself dry with a clean towel.            10.  Wear clean pajamas.            11.  Place clean sheets on your bed the night of your first shower and do not  sleep with pets. Day of Surgery : Do not apply any lotions/deodorants the morning of surgery.  Please wear clean clothes to the hospital/surgery center.  FAILURE TO FOLLOW THESE INSTRUCTIONS MAY RESULT IN THE CANCELLATION OF YOUR SURGERY  PATIENT SIGNATURE_________________________________  NURSE SIGNATURE__________________________________  ________________________________________________________________________

## 2022-12-05 ENCOUNTER — Other Ambulatory Visit: Payer: Self-pay

## 2022-12-05 ENCOUNTER — Encounter (HOSPITAL_COMMUNITY)
Admission: RE | Admit: 2022-12-05 | Discharge: 2022-12-05 | Disposition: A | Discharge status: Home / Self Care | Payer: BC Managed Care – PPO | Source: Ambulatory Visit | Attending: Anesthesiology | Admitting: Anesthesiology

## 2022-12-05 ENCOUNTER — Encounter (HOSPITAL_COMMUNITY): Payer: Self-pay

## 2022-12-05 VITALS — BP 119/85 | HR 80 | Temp 98.1°F | Resp 16 | Ht 63.0 in | Wt 155.0 lb

## 2022-12-05 DIAGNOSIS — Z01812 Encounter for preprocedural laboratory examination: Secondary | ICD-10-CM | POA: Diagnosis present

## 2022-12-05 DIAGNOSIS — D518 Other vitamin B12 deficiency anemias: Secondary | ICD-10-CM | POA: Diagnosis present

## 2022-12-05 HISTORY — DX: Malignant (primary) neoplasm, unspecified: C80.1

## 2022-12-05 HISTORY — DX: Unspecified osteoarthritis, unspecified site: M19.90

## 2022-12-05 LAB — CBC
HCT: 40.1 % (ref 36.0–46.0)
Hemoglobin: 13.2 g/dL (ref 12.0–15.0)
MCH: 28.4 pg (ref 26.0–34.0)
MCHC: 32.9 g/dL (ref 30.0–36.0)
MCV: 86.2 fL (ref 80.0–100.0)
Platelets: 294 10*3/uL (ref 150–400)
RBC: 4.65 MIL/uL (ref 3.87–5.11)
RDW: 12.6 % (ref 11.5–15.5)
WBC: 4.9 10*3/uL (ref 4.0–10.5)
nRBC: 0 % (ref 0.0–0.2)

## 2022-12-15 ENCOUNTER — Encounter (HOSPITAL_COMMUNITY): Payer: Self-pay | Admitting: Urology

## 2022-12-15 ENCOUNTER — Ambulatory Visit (HOSPITAL_COMMUNITY): Payer: BC Managed Care – PPO | Admitting: Anesthesiology

## 2022-12-15 ENCOUNTER — Ambulatory Visit (HOSPITAL_COMMUNITY): Payer: BC Managed Care – PPO

## 2022-12-15 ENCOUNTER — Ambulatory Visit (HOSPITAL_COMMUNITY)
Admission: RE | Admit: 2022-12-15 | Discharge: 2022-12-15 | Disposition: A | Discharge status: Home / Self Care | Payer: BC Managed Care – PPO | Attending: Urology | Admitting: Urology

## 2022-12-15 ENCOUNTER — Encounter (HOSPITAL_COMMUNITY)
Admission: RE | Disposition: A | Discharge status: Home / Self Care | Payer: Self-pay | Source: Home / Self Care | Attending: Urology

## 2022-12-15 DIAGNOSIS — Z6827 Body mass index (BMI) 27.0-27.9, adult: Secondary | ICD-10-CM | POA: Diagnosis not present

## 2022-12-15 DIAGNOSIS — R310 Gross hematuria: Secondary | ICD-10-CM | POA: Insufficient documentation

## 2022-12-15 DIAGNOSIS — E669 Obesity, unspecified: Secondary | ICD-10-CM | POA: Insufficient documentation

## 2022-12-15 HISTORY — PX: CYSTOSCOPY WITH RETROGRADE PYELOGRAM, URETEROSCOPY AND STENT PLACEMENT: SHX5789

## 2022-12-15 LAB — CREATININE, SERUM
Creatinine, Ser: 0.64 mg/dL (ref 0.44–1.00)
GFR, Estimated: >60 mL/min (ref >60)

## 2022-12-15 SURGERY — CYSTOURETEROSCOPY, WITH RETROGRADE PYELOGRAM AND STENT INSERTION
Anesthesia: General | Laterality: Bilateral

## 2022-12-15 MED ORDER — OXYCODONE-ACETAMINOPHEN 5-325 MG PO TABS
1.0000 | ORAL_TABLET | Freq: Four times a day (QID) | ORAL | 0 refills | Status: AC | PRN
Start: 2022-12-15 — End: 2023-12-15

## 2022-12-15 MED ORDER — 0.9 % SODIUM CHLORIDE (POUR BTL) OPTIME
TOPICAL | Status: DC | PRN
  Administered 2022-12-15: 1000 mL

## 2022-12-15 MED ORDER — DEXAMETHASONE SODIUM PHOSPHATE 10 MG/ML IJ SOLN
INTRAMUSCULAR | Status: DC | PRN
  Administered 2022-12-15: 10 mg via INTRAVENOUS

## 2022-12-15 MED ORDER — MEPERIDINE HCL 50 MG/ML IJ SOLN: 6.2500 mg | INTRAMUSCULAR | Status: DC | PRN

## 2022-12-15 MED ORDER — OXYCODONE HCL 5 MG/5ML PO SOLN: 5.0000 mg | Freq: Once | ORAL | Status: DC | PRN

## 2022-12-15 MED ORDER — GENTAMICIN SULFATE 40 MG/ML IJ SOLN
5.0000 mg/kg | INTRAVENOUS | Status: AC
  Administered 2022-12-15: 300 mg via INTRAVENOUS
  Filled 2022-12-15: qty 7.5

## 2022-12-15 MED ORDER — FENTANYL CITRATE (PF) 100 MCG/2ML IJ SOLN
INTRAMUSCULAR | Status: DC | PRN
  Administered 2022-12-15 (×2): 25 ug via INTRAVENOUS
  Administered 2022-12-15: 50 ug via INTRAVENOUS

## 2022-12-15 MED ORDER — PROPOFOL 10 MG/ML IV BOLUS
INTRAVENOUS | Status: AC
  Filled 2022-12-15: qty 20

## 2022-12-15 MED ORDER — OXYCODONE HCL 5 MG PO TABS: 5.0000 mg | ORAL_TABLET | Freq: Once | ORAL | Status: DC | PRN

## 2022-12-15 MED ORDER — IOHEXOL 300 MG/ML  SOLN
INTRAMUSCULAR | Status: DC | PRN
  Administered 2022-12-15: 10 mL

## 2022-12-15 MED ORDER — PROPOFOL 10 MG/ML IV BOLUS
INTRAVENOUS | Status: DC | PRN
  Administered 2022-12-15: 200 mg via INTRAVENOUS

## 2022-12-15 MED ORDER — SCOPOLAMINE 1 MG/3DAYS TD PT72: 1.0000 | MEDICATED_PATCH | TRANSDERMAL | Status: DC

## 2022-12-15 MED ORDER — LACTATED RINGERS IV SOLN: INTRAVENOUS | Status: DC

## 2022-12-15 MED ORDER — KETOROLAC TROMETHAMINE 10 MG PO TABS: 10.0000 mg | ORAL_TABLET | Freq: Three times a day (TID) | ORAL | 0 refills | Status: AC | PRN

## 2022-12-15 MED ORDER — ONDANSETRON HCL 4 MG/2ML IJ SOLN
INTRAMUSCULAR | Status: DC | PRN
  Administered 2022-12-15: 4 mg via INTRAVENOUS

## 2022-12-15 MED ORDER — SENNOSIDES-DOCUSATE SODIUM 8.6-50 MG PO TABS: 1.0000 | ORAL_TABLET | Freq: Two times a day (BID) | ORAL | 0 refills | Status: AC

## 2022-12-15 MED ORDER — DEXAMETHASONE SODIUM PHOSPHATE 10 MG/ML IJ SOLN
INTRAMUSCULAR | Status: AC
  Filled 2022-12-15: qty 1

## 2022-12-15 MED ORDER — MIDAZOLAM HCL 2 MG/2ML IJ SOLN
INTRAMUSCULAR | Status: AC
  Filled 2022-12-15: qty 2

## 2022-12-15 MED ORDER — HYDROMORPHONE HCL 1 MG/ML IJ SOLN: 0.2500 mg | INTRAMUSCULAR | Status: DC | PRN

## 2022-12-15 MED ORDER — CHLORHEXIDINE GLUCONATE 0.12 % MT SOLN
15.0000 mL | Freq: Once | OROMUCOSAL | Status: AC
  Administered 2022-12-15: 15 mL via OROMUCOSAL

## 2022-12-15 MED ORDER — AMISULPRIDE (ANTIEMETIC) 5 MG/2ML IV SOLN
INTRAVENOUS | Status: AC
  Filled 2022-12-15: qty 4

## 2022-12-15 MED ORDER — PROMETHAZINE HCL 25 MG/ML IJ SOLN: 6.2500 mg | INTRAMUSCULAR | Status: DC | PRN

## 2022-12-15 MED ORDER — AMISULPRIDE (ANTIEMETIC) 5 MG/2ML IV SOLN
10.0000 mg | Freq: Once | INTRAVENOUS | Status: AC | PRN
  Administered 2022-12-15: 10 mg via INTRAVENOUS

## 2022-12-15 MED ORDER — MIDAZOLAM HCL 5 MG/5ML IJ SOLN
INTRAMUSCULAR | Status: DC | PRN
Start: 2022-12-15 — End: 2022-12-15
  Administered 2022-12-15: 2 mg via INTRAVENOUS

## 2022-12-15 MED ORDER — ORAL CARE MOUTH RINSE: 15.0000 mL | Freq: Once | OROMUCOSAL | Status: AC

## 2022-12-15 MED ORDER — FENTANYL CITRATE (PF) 100 MCG/2ML IJ SOLN
INTRAMUSCULAR | Status: AC
  Filled 2022-12-15: qty 2

## 2022-12-15 MED ORDER — SODIUM CHLORIDE 0.9 % IR SOLN
Status: DC | PRN
  Administered 2022-12-15: 3000 mL

## 2022-12-15 MED ORDER — LIDOCAINE HCL (PF) 2 % IJ SOLN
INTRAMUSCULAR | Status: DC | PRN
  Administered 2022-12-15: 100 mg via INTRADERMAL

## 2022-12-15 MED ORDER — SCOPOLAMINE 1 MG/3DAYS TD PT72
MEDICATED_PATCH | TRANSDERMAL | Status: AC
  Administered 2022-12-15: 1.5 mg via TRANSDERMAL
  Filled 2022-12-15: qty 1

## 2022-12-15 SURGICAL SUPPLY — 30 items
BAG DRN RND TRDRP ANRFLXCHMBR (UROLOGICAL SUPPLIES)
BAG URINE DRAIN 2000ML AR STRL (UROLOGICAL SUPPLIES) IMPLANT
BAG URO CATCHER STRL LF (MISCELLANEOUS) ×2 IMPLANT
BASKET LASER NITINOL 1.9FR (BASKET) IMPLANT
BSKT STON RTRVL 120 1.9FR (BASKET)
CATH URETL OPEN END 6FR 70 (CATHETERS) ×2 IMPLANT
CLOTH BEACON ORANGE TIMEOUT ST (SAFETY) ×2 IMPLANT
DRAPE FOOT SWITCH (DRAPES) ×2 IMPLANT
ELECT REM PT RETURN 15FT ADLT (MISCELLANEOUS) ×2 IMPLANT
EVACUATOR MICROVAS BLADDER (UROLOGICAL SUPPLIES) IMPLANT
EXTRACTOR STONE 1.7FRX115CM (UROLOGICAL SUPPLIES) IMPLANT
GLOVE SURG LX STRL 7.5 STRW (GLOVE) ×2 IMPLANT
GOWN STRL REUS W/ TWL XL LVL3 (GOWN DISPOSABLE) ×2 IMPLANT
GOWN STRL REUS W/TWL XL LVL3 (GOWN DISPOSABLE) ×2
GUIDEWIRE ANG ZIPWIRE 038X150 (WIRE) ×3 IMPLANT
GUIDEWIRE STR DUAL SENSOR (WIRE) ×3 IMPLANT
KIT TURNOVER KIT A (KITS) IMPLANT
LASER FIB FLEXIVA PULSE ID 365 (Laser) IMPLANT
LOOP CUT BIPOLAR 24F LRG (ELECTROSURGICAL) IMPLANT
MANIFOLD NEPTUNE II (INSTRUMENTS) ×2 IMPLANT
PACK CYSTO (CUSTOM PROCEDURE TRAY) ×2 IMPLANT
SHEATH NAVIGATOR HD 11/13X28 (SHEATH) IMPLANT
SHEATH NAVIGATOR HD 11/13X36 (SHEATH) IMPLANT
SYR TOOMEY IRRIG 70ML (MISCELLANEOUS)
SYRINGE TOOMEY IRRIG 70ML (MISCELLANEOUS) IMPLANT
TRACTIP FLEXIVA PULS ID 200XHI (Laser) IMPLANT
TRACTIP FLEXIVA PULSE ID 200 (Laser)
TUBE PU 8FR 16IN ENFIT (TUBING) ×2 IMPLANT
TUBING CONNECTING 10 (TUBING) ×2 IMPLANT
TUBING UROLOGY SET (TUBING) ×2 IMPLANT

## 2022-12-15 NOTE — Discharge Instructions (Addendum)
1 - You may have urinary urgency (bladder spasms) and bloody urine on / off for up to a week. This is normal.  2 - Call MD or go to ER for fever >102, severe pain / nausea / vomiting not relieved by medications, or acute change in medical status

## 2022-12-15 NOTE — Brief Op Note (Signed)
12/15/2022  12:17 PM  PATIENT:  Victoria Hampton  62 y.o. female  PRE-OPERATIVE DIAGNOSIS:  GROSS HEMATURIA  POST-OPERATIVE DIAGNOSIS:  GROSS HEMATURIA  PROCEDURE:  Procedure(s) with comments: DIAGNOSTIC CYSTOSCOPY WITH RETROGRADE PYELOGRAM, URETEROSCOPY AND  POSSIBLE STENT PLACEMENT (Bilateral) - 1 HR  SURGEON:  Surgeons and Role:    * Wane Mollett, Delbert Phenix., MD - Primary  PHYSICIAN ASSISTANT:   ASSISTANTS: none   ANESTHESIA:   general  EBL:  minimal   BLOOD ADMINISTERED:none  DRAINS: none   LOCAL MEDICATIONS USED:  NONE  SPECIMEN:  No Specimen  DISPOSITION OF SPECIMEN:  N/A  COUNTS:  YES  TOURNIQUET:  * No tourniquets in log *  DICTATION: .Other Dictation: Dictation Number [id number]  PLAN OF CARE: Discharge to home after PACU  PATIENT DISPOSITION:  PACU - hemodynamically stable.   Delay start of Pharmacological VTE agent (>24hrs) due to surgical blood loss or risk of bleeding: yes

## 2022-12-15 NOTE — Anesthesia Procedure Notes (Signed)
Procedure Name: LMA Insertion Date/Time: 12/15/2022 11:50 AM  Performed by: Florene Route, CRNAPatient Re-evaluated:Patient Re-evaluated prior to induction Oxygen Delivery Method: Circle system utilized Preoxygenation: Pre-oxygenation with 100% oxygen Induction Type: IV induction LMA: LMA with gastric port inserted LMA Size: 4.0 Number of attempts: 1 Placement Confirmation: positive ETCO2 and breath sounds checked- equal and bilateral Tube secured with: Tape Dental Injury: Teeth and Oropharynx as per pre-operative assessment

## 2022-12-15 NOTE — Anesthesia Postprocedure Evaluation (Signed)
Anesthesia Post Note  Patient: Victoria Hampton  Procedure(s) Performed: DIAGNOSTIC CYSTOSCOPY WITH RETROGRADE PYELOGRAM, URETEROSCOPY BILATERAL (Bilateral)     Patient location during evaluation: PACU Anesthesia Type: General Level of consciousness: awake and alert Pain management: pain level controlled Vital Signs Assessment: post-procedure vital signs reviewed and stable Respiratory status: spontaneous breathing, nonlabored ventilation and respiratory function stable Cardiovascular status: blood pressure returned to baseline and stable Postop Assessment: no apparent nausea or vomiting Anesthetic complications: no   No notable events documented.  Last Vitals:  Vitals:   12/15/22 1315 12/15/22 1330  BP: 120/68 106/63  Pulse: 87 67  Resp:    Temp:    SpO2: 100% 100%    Last Pain:  Vitals:   12/15/22 1308  TempSrc:   PainSc: 0-No pain                 Lowella Curb

## 2022-12-15 NOTE — H&P (Signed)
Victoria Hampton is an 62 y.o. female.    Chief Complaint: Pre0-Op Cysto, Retrogrades, Diagnostic Ureteroscopy / Possible TURBT  HPI:   1- Recurrent Gross Hematuria - Non smoker. No chronic solvent expsoure. Negative eval with CT and cysto 2014.  ??06/2021 - recurrent visible blood x few days and significant left flank pain. UA withtou infectious parameters but impressive blood. CT stone w/o stones, but some ? thickening / inflammation along left ureter. Recommended diagnostic ureteroscopy but she last minute cancelled.   ?PMH sig for anemia, fatigue, migrain (2 years Topomax Use, now off as of 2023). Her PCP is with [facility] in [place].   ???Today Victoria Hampton is seen to proceed with cysto, bilateral retrogrades, bilateral diagnostic ureteroscoy for recurrent gross hematuria to max r/o intraluminal neoplasia. No interval fevers. Most recent UA without infectious parameters.    Past Medical History:  Diagnosis Date   Arthritis    Cancer (HCC)    History of anemia    Migraine    with visual disturbance   Migraine with aura 11/19/2012   Mild obesity    Vertigo    Vitamin B12 deficiency     Past Surgical History:  Procedure Laterality Date   APPENDECTOMY  04/25/2011   TOE SURGERY Left     Family History  Problem Relation Age of Onset   Interstitial cystitis Mother    Cancer Mother        bcc   Migraines Brother    Social History:  reports that she has never smoked. She has never used smokeless tobacco. She reports that she does not drink alcohol and does not use drugs.  Allergies: No Known Allergies  No medications prior to admission.    No results found for this or any previous visit (from the past 48 hour(s)). No results found.  Review of Systems  Constitutional:  Negative for chills and fever.  Genitourinary:  Positive for hematuria.  All other systems reviewed and are negative.   Last menstrual period 07/18/2010. Physical Exam Vitals reviewed.  HENT:     Head:  Normocephalic.  Eyes:     Pupils: Pupils are equal, round, and reactive to light.  Pulmonary:     Effort: Pulmonary effort is normal.  Abdominal:     General: Abdomen is flat.  Musculoskeletal:     Cervical back: Normal range of motion.     Comments: No CVAT at present  Skin:    General: Skin is warm.  Neurological:     General: No focal deficit present.     Mental Status: She is alert.  Psychiatric:        Mood and Affect: Mood normal.      Assessment/Plan  Proceed as planned with cysto, BILATERAL retrogrades / diagnostric ureteroscopy. Risks, benefits, alternatives, expected peri-op course discussed previously and reiterated today.   Loletta Parish., MD 12/15/2022, 5:32 AM

## 2022-12-15 NOTE — Anesthesia Preprocedure Evaluation (Addendum)
Anesthesia Evaluation  Patient identified by MRN, date of birth, ID band Patient awake    Reviewed: Allergy & Precautions, H&P , NPO status , Patient's Chart, lab work & pertinent test results  Airway Mallampati: II  TM Distance: >3 FB Neck ROM: Full    Dental no notable dental hx.    Pulmonary neg pulmonary ROS   Pulmonary exam normal breath sounds clear to auscultation       Cardiovascular negative cardio ROS Normal cardiovascular exam Rhythm:Regular Rate:Normal     Neuro/Psych  Headaches  negative psych ROS   GI/Hepatic negative GI ROS, Neg liver ROS,,,  Endo/Other  negative endocrine ROS    Renal/GU negative Renal ROS  negative genitourinary   Musculoskeletal  (+) Arthritis , Osteoarthritis,    Abdominal   Peds negative pediatric ROS (+)  Hematology negative hematology ROS (+)   Anesthesia Other Findings   Reproductive/Obstetrics negative OB ROS                             Anesthesia Physical Anesthesia Plan  ASA: 2  Anesthesia Plan: General   Post-op Pain Management:    Induction: Intravenous  PONV Risk Score and Plan: 3 and Ondansetron, Dexamethasone, Midazolam and Treatment may vary due to age or medical condition  Airway Management Planned: LMA  Additional Equipment:   Intra-op Plan:   Post-operative Plan: Extubation in OR  Informed Consent: I have reviewed the patients History and Physical, chart, labs and discussed the procedure including the risks, benefits and alternatives for the proposed anesthesia with the patient or authorized representative who has indicated his/her understanding and acceptance.     Dental advisory given  Plan Discussed with: CRNA  Anesthesia Plan Comments:        Anesthesia Quick Evaluation

## 2022-12-15 NOTE — Op Note (Unsigned)
Victoria Hampton, [place] MEDICAL RECORD NO: [id number] ACCOUNT NO: [id number] DATE OF BIRTH: 01-28-1961 FACILITY: [facility] LOCATION: [facility] PHYSICIAN: Sebastian Ache, MD  Operative Report   DATE OF PROCEDURE: 12/15/2022  PREOPERATIVE DIAGNOSIS:  Recurrent hematuria.  POSTOPERATIVE DIAGNOSIS:  Recurrent hematuria.  PROCEDURE PERFORMED:   1.  Cystoscopy with bilateral retrograde pyelograms, interpretation. 2.  Bilateral diagnostic ureteroscopy. 3.  Exam under anesthesia.  ESTIMATED BLOOD LOSS:  Nil.  COMPLICATIONS:  None.  SPECIMEN:  None.  FINDINGS: 1.  Completely unremarkable bladder, ureters and intrarenal kidneys bilaterally. 2.  No obvious sources of hematuria noted. 3.  Unremarkable pelvic exam, bimanual exam.  INDICATIONS:  The patient is a 62 year old lady with history of intermittent gross hematuria for quite some time.  She was evaluated last year and there was some questionable distal left ureteral thickening.  I recommended ureteroscopy at that time to  rule out any intraluminal neoplasia.  She unfortunately canceled her surgery then represented again this year with similar intermittent hematuria as there were no overt etiology, previously found.  I again recommended endoscopic evaluation, especially  with goal of ruling out any ureteral neoplasia.  There was a questionable thickening on prior imaging.  She wished to proceed.  Informed consent was obtained and placed in medical record.  PROCEDURE IN DETAIL:  The patient being identified and verified, procedure being cystoscopy, bilateral retrogrades bilateral diagnostic ureteroscopy was confirmed.  Procedure timeout was performed.  Intravenous antibiotics administered.  General LMA  anesthesia induced.  The patient was placed into a low lithotomy position.  Sterile field was created, prepped and draped the patient's vagina, introitus, and proximal thighs using iodine.  Cystourethroscopy was performed using 21-French rigid  cystoscope  with offset lens.  Inspection of urinary bladder revealed no diverticula, calcifications, papillary lesions.  Ureteral orifices were single.  The right ureteral orifice was cannulated with 6-French open-ended catheter, and right retrograde pyelogram was  obtained.  Right retrograde pyelogram demonstrated a single right ureter, single system right kidney.  No filling defects or narrowing noted.  No hydronephrosis noted whatsoever. What appeared to be Filshie clips were noted in the bilateral pelvis.  A 0.038 ZIPwire  was advanced to the level of the upper poles set aside as a safety wire.  Next, left retrograde pyelogram was obtained.  Left retrograde pyelogram demonstrated single left ureter, single system left kidney.  No filling defects or narrowing noted.  No hydronephrosis noted whatsoever.  A separate ZIPwire was then advanced to the lower pole and set as a safety wire.  An  8-French feeding tube placed in the urinary bladder for pressure release.  Next, semirigid ureteroscopy performed of the distal orifice left ureter alongside a separate sensory working wire.  No mucosal abnormalities were seen whatsoever.  Exquisite care  was taken to visualize the distal ureter, which had some prior concern about the thickening area.  Again, there was no evidence of neoplasia whatsoever.  Next, semirigid ureteroscopy was performed of the distal orifice of the right ureter alongside a  separate sensory working wire.  No mucosal abnormalities were found.  Semirigid scope was then exchanged over the sensory working wire for a single channel digital ureteroscope.  This allowed inspection of the proximal right ureter and systematic  inspection of the right kidney, including all calices x2 and no evidence of intraluminal stone or neoplasia was seen whatsoever.  There was no obvious causes for hematuria within the right kidney or entire visualized segments of the right ureter.  Next,  the flexible  ureteroscope was placed over the left sensory working wire to the level of proximal left ureter and systematic inspection was performed proximal left ureter and left kidney including all calices x2.  Similarly no evidence of intraluminal  neoplasia or stones were seen whatsoever.  We achieved the goals of surgery today, especially ruling out any neoplastic etiology or ureterolithiasis to her hematuria.  Given the relatively atraumatic nature of the procedure it was not felt that stenting  would be warranted.  Bladder was emptied per cystoscope.  Procedure was then terminated.  The patient tolerated procedure well, no immediate perioperative complications.  The patient was taken to postanesthesia care unit in stable condition.  After endoscopic exam given her hematuria I also performed pelvic exam very careful palpation of the vaginal vault, inspection of the introitus revealed no obvious neoplasia.  Very careful palpation of the cervix also was unremarkable.  Similarly digital  rectal exam was unremarkable as well.  Notably, there was no gross blood in the vaginal vault or in the rectal vault.   PUS D: 12/15/2022 12:24:14 pm T: 12/15/2022 12:43:00 pm  JOB: [id number]/ [id number]

## 2022-12-15 NOTE — Transfer of Care (Signed)
Immediate Anesthesia Transfer of Care Note  Patient: Victoria Hampton  Procedure(s) Performed: DIAGNOSTIC CYSTOSCOPY WITH RETROGRADE PYELOGRAM, URETEROSCOPY BILATERAL (Bilateral)  Patient Location: PACU  Anesthesia Type:General  Level of Consciousness: awake, alert , oriented, and patient cooperative  Airway & Oxygen Therapy: Patient Spontanous Breathing and Patient connected to nasal cannula oxygen  Post-op Assessment: Report given to RN, Post -op Vital signs reviewed and stable, and Patient moving all extremities  Post vital signs: Reviewed and stable  Last Vitals:  Vitals Value Taken Time  BP 124/75 12/15/22 1230  Temp    Pulse 83 12/15/22 1234  Resp 12 12/15/22 1234  SpO2 99 % 12/15/22 1234  Vitals shown include unfiled device data.  Last Pain:  Vitals:   12/15/22 0933  TempSrc:   PainSc: 0-No pain      Patients Stated Pain Goal: 4 (12/15/22 0933)  Complications: No notable events documented.

## 2022-12-16 ENCOUNTER — Encounter (HOSPITAL_COMMUNITY): Payer: Self-pay | Admitting: Urology
